# Patient Record
Sex: Female | Born: 1935 | Race: Black or African American | Hispanic: No | Smoking: Former smoker
Health system: Southern US, Community
[De-identification: ages and names within clinical notes are randomized; demographics above are authoritative.]

## PROBLEM LIST (undated history)

## (undated) ENCOUNTER — Emergency Department (HOSPITAL_COMMUNITY): Admission: EM | Disposition: A | Discharge status: Home / Self Care | Payer: Self-pay

## (undated) DIAGNOSIS — M1990 Unspecified osteoarthritis, unspecified site: Secondary | ICD-10-CM

## (undated) DIAGNOSIS — I10 Essential (primary) hypertension: Secondary | ICD-10-CM

## (undated) DIAGNOSIS — N289 Disorder of kidney and ureter, unspecified: Secondary | ICD-10-CM

## (undated) HISTORY — PX: BACK SURGERY: SHX140

---

## 2004-02-08 ENCOUNTER — Inpatient Hospital Stay (HOSPITAL_COMMUNITY): Admission: RE | Admit: 2004-02-08 | Discharge: 2004-02-15 | Payer: Self-pay | Admitting: Specialist

## 2004-02-11 ENCOUNTER — Ambulatory Visit: Payer: Self-pay | Admitting: Physical Medicine & Rehabilitation

## 2004-02-28 ENCOUNTER — Encounter: Admission: RE | Admit: 2004-02-28 | Discharge: 2004-02-28 | Payer: Self-pay | Admitting: Specialist

## 2004-02-28 ENCOUNTER — Encounter: Payer: Self-pay | Admitting: Orthopedic Surgery

## 2004-03-03 ENCOUNTER — Inpatient Hospital Stay (HOSPITAL_COMMUNITY): Admission: AD | Admit: 2004-03-03 | Discharge: 2004-03-04 | Payer: Self-pay | Admitting: Specialist

## 2004-03-08 ENCOUNTER — Emergency Department (HOSPITAL_COMMUNITY): Admission: EM | Admit: 2004-03-08 | Discharge: 2004-03-08 | Payer: Self-pay | Admitting: Emergency Medicine

## 2005-09-10 ENCOUNTER — Encounter: Admission: RE | Admit: 2005-09-10 | Discharge: 2005-09-10 | Payer: Self-pay | Admitting: Nephrology

## 2007-05-19 ENCOUNTER — Ambulatory Visit: Payer: Self-pay | Admitting: Orthopedic Surgery

## 2007-05-19 DIAGNOSIS — M1710 Unilateral primary osteoarthritis, unspecified knee: Secondary | ICD-10-CM

## 2007-05-19 DIAGNOSIS — M25569 Pain in unspecified knee: Secondary | ICD-10-CM

## 2010-06-09 NOTE — Discharge Summary (Signed)
NAMESILVER, ACHEY            ACCOUNT NO.:  [id number]   MEDICAL RECORD NO.:  [id number]          PATIENT TYPE:  INP   LOCATION:  5041                         FACILITY:  [facility]   PHYSICIAN:  Kerrin Champagne, M.D.   DATE OF BIRTH:  04-18-1922   DATE OF ADMISSION:  02/08/2004  DATE OF DISCHARGE:                                 DISCHARGE SUMMARY   ADMISSION DIAGNOSES:  1.  Severe lumbar spinal stenosis with degenerative spondylolisthesis grade      1 at the L3-4 level and grade 1-2 at the L4-5 level.  2.  Hypertension.  3.  Non-insulin-dependent diabetes mellitus.  4.  Osteoporosis.  5.  Status post bilateral cataract removal with lens implants.   DISCHARGE DIAGNOSES:  1.  Severe lumbar spinal stenosis with degenerative spondylolisthesis grade      1 at the L3-4 level and grade 1-2 at the L4-5 level.  2.  Hypertension.  3.  Non-insulin-dependent diabetes mellitus.  4.  Osteoporosis.  5.  Status post bilateral cataract removal with lens implants.  6.  Postoperative anemia requiring blood transfusions.  7.  Constipation, resolved at discharge.   PROCEDURE:  On February 08, 2004 the patient underwent:  1.  Central decompressive laminectomy L3-4 and L4-5 with both L3-4 and L4-5      right-sided transforaminal lumbar interbody fusions.  2.  Posterolateral fusion L3-L5 with Monarch pedicle screws and rod      fixation.  3.  Both local bone graft and Symphony bone graft material used for fusion      sites.   This was performed by Dr. Otelia Sergeant, assisted by Maud Deed, P.A. under  general anesthesia.   CONSULTATIONS:  None.   BRIEF HISTORY:  The patient is a 77 year old female with progressive  neurogenic claudication.  The patient has had worsening of her discomfort to  the point that she stoops for walking.  She has had no significant  improvement in her symptoms with conservative management including  antiinflammatory medications and steroid injections.  Multiple studies have  shown the patient to have severe spinal stenosis of the lumbar spine.  It  was felt that she would require surgical intervention for the lumbar spine  to allow her progressive ambulation as well as return to independent  activities of daily living.  She does have severe degenerative arthritis of  bilateral knees as well.  However, the spinal stenosis was felt to be more  debilitating at this time.  Therefore, she was admitted to undergo the  procedure as stated above.   BRIEF HOSPITAL COURSE:  The patient tolerated the procedure under general  anesthesia without complications.  Intraoperatively, she did require 2 units  of packed red blood cells.  Postoperatively she had low urine output and was  treated with a bolus of IV fluids.  Unfortunately, at that time the fluids  did contain glucose and her blood sugar did become elevated.  After change  in IV fluids, blood sugars were noted to be normal throughout the remainder  of the hospital stay.  Her diabetes was managed quite well by placing her on  her oral medications and avoiding any concentrated sweets.  The patient's  Hemovac drain was discontinued on postoperative day #1.  Her wound was  checked daily.  She did have mild amount of serosanguineous drainage for a  couple of days.  This resolved and the wound was found to be healing well  throughout the remainder of the hospital stay.  The patient had elevated  temperatures initially which were treated with incentive spirometry and  coughing and deep breathing.  This treatment helped to resolve her  temperatures.  The patient's Foley catheter was discontinued and she was  able to void without difficulty.  The patient had constipation which was  treated with oral laxative, stool softeners, as well as suppositories and  she was able to have bowel movements normally.  Her diet was increased  steadily as she was taken off ileus precautions.  The patient's physical  therapy was started on  postoperative day #1.  She was fitted with an LSO  brace and instructed in donning and doffing the brace at bedside.  She was  allowed ambulation, weightbearing as tolerated using a walker.  She was able  to progress fairly steadily.  She was felt to be a possible inpatient rehab  candidate; however, was doing too well to meet their criteria.  Due to the  fact that she had no assistance at home because her daughter had been sick,  she did feel she needed an extended stay somewhere.  It was agreed that she  would need further intensive rehabilitation before returning home to be  independent.  Fortunately, a bed was available at [facility].  On  February 15, 2004 the patient was afebrile, vital signs were stable, and she  was felt stable for transfer.   PERTINENT LABORATORY VALUES:  On admission, CBC with hemoglobin 11.2,  hematocrit 33.1.  The patient received blood transfusion totaling 2 units of  packed red blood cells during the hospital stay.  Prior to discharge, on  February 13, 2004, her hemoglobin was stable at 9.0 with hematocrit 26.1.  Coagulation studies on admission were within normal limits.  Chemistry  studies on admission showed values normal with exception of glucose 107.  All values remained normal throughout the hospital stay with the exception  of glucose being elevated multiple times.  Blood sugars ranged from 107 to  232.  Calcium was mildly decreased at 8.1 at the time of discharge.  Urinalysis on admission showed small leukocyte esterase, few epithelial  cells, 0-2 wbc's, 0-2 rbc's, and few bacteria.  Repeat on January 21 was  negative.  Culture from January 21 showed multiple species present with no  uropathogens isolated.  Chest x-ray on admission showed no acute disease.  EKG on admission:  Probable accelerated junctional rhythm, unconfirmed  analysis on the chart.  PLAN:  The patient will be transferred to [facility].  There she  should receive  physical therapy daily for ambulation and gait training and  back care program.  She is to wear her LSO at all times when out of bed.  She may don and doff the brace while seated at bedside.  She should ambulate  with a walker.  The patient should received occupational therapy for ADLs.  She should have dressing changes daily.  Steri-Strips are present on the  wound and should be allowed to resolve spontaneously.  The wound may be wet  at this time for showering purposes.  She should continue on a low  carbohydrate diabetic diet.   MEDICATIONS AT DISCHARGE:  1.  Lisinopril 2.5 mg daily.  2.  Norvasc 2.5 mg daily.  3.  Avapro 300 mg daily.  4.  Glucophage 500 mg p.o. b.i.d.  5.  Trinsicon one p.o. b.i.d.  6.  P.r.n. medications include:      1.  Vicodin one to two q.4-6h. as needed for pain.      2.  Restoril 15 mg one p.o. q.h.s. p.r.n. sleep.      3.  Robaxin 500 mg one q.8h. as needed for spasm.  7.  The patient does have history of constipation and uses a stool softener      daily.  In the hospital she has received Colace one p.o. b.i.d.  Senokot      and Dulcolax have been used as laxatives as needed.  8.  The patient had some reflux symptoms during the hospital stay and was      placed on Protonix 40 mg p.o. daily.  She should  remain on this as      well.   The patient will follow up with Dr. Otelia Sergeant 2 weeks from the date of her  surgery.  An appointment can be made by calling [phone].  Transportation  should be provided for the patient to come to Dr. Barbaraann Faster office.  Ice may  be used to the back on a p.r.n. basis.  If there are questions or concerns  regarding her orthopedic care, please notify Dr. Barbaraann Faster office at [phone].      SMV/MEDQ  D:  02/15/2004  T:  02/15/2004  Job:  [id number]

## 2010-06-09 NOTE — Discharge Summary (Signed)
NAME:  Kathleen Weeks, Kathleen Weeks            ACCOUNT NO.:  [id number]   MEDICAL RECORD NO.:  [id number]          PATIENT TYPE:  OIB   LOCATION:  5013                         FACILITY:  [facility]   PHYSICIAN:  Kerrin Champagne, M.D.   DATE OF BIRTH:  1923/01/02   DATE OF ADMISSION:  03/03/2004  DATE OF DISCHARGE:                                 DISCHARGE SUMMARY   ADMISSION DIAGNOSES:  1.  Retropulsed cage right L4-5 with L4 and L5 radiculopathy.  2.  2 weeks status post lumbar decompression at L3-4 and L4-5 with 360      degree fusion, both translaminar interbody fusion and posterior      instrumented posterolateral fusion with pedicle screws and rods.  3.  Noninsulin-dependent diabetes mellitus.  4.  Hypertension.  5.  Mild anemia.   DISCHARGE DIAGNOSES:  1.  Retropulsed cage right L4-5 with L4 and L5 radiculopathy.  2.  2 weeks status post lumbar decompression at L3-4 and L4-5 with 360      degree fusion, both translaminar interbody fusion and posterior      instrumented posterolateral fusion with pedicle screws and rods.  3.  Noninsulin-dependent diabetes mellitus.  4.  Hypertension.  5.  Mild anemia.  6.  Urinary tract infection.   PROCEDURE:  On March 03, 2004, the patient underwent removal of  retropulsed cage right L4-5 interspace with bone grafting over the right L4-  5 intervertebral disk space utilizing graft from the cage and local  intervertebral disk space previous graft. This was performed by Dr. Otelia Sergeant,  assisted by Maud Deed, P.A.-C under general anesthesia.   CONSULTATIONS:  None.   BRIEF HISTORY:  The patient is a 77 year old female status post lumbar  decompression at L3-4 and L4-5 with 360 degree fusion for severe spinal  stenosis. She did well in the hospital without complications and was  discharged to [facility]. On her return to Dr.  Barbaraann Faster office approximately 2 to 3 weeks postop, radiographs demonstrated  presence of markers for  the cages at the lowest segment at L4-5 to be  located within the spinal canal. It was felt that the cage retropulsed into  the spinal canal. A followup CT scan demonstrated the cage retropulsed into  the spinal canal. The patient also had severe weakness of the right  dorsiflexion with numbness over the dorsal aspect of the right foot  consistent with a right L4 and L5 radiculopathy. It was felt that she would  require removal of the cage and was admitted for the procedure as stated  above.   BRIEF HOSPITAL COURSE:  The patient tolerated the procedure under general  anesthetic without complications. Postoperatively, she began having better  function of the right EHL and ankle dorsiflexion. Although the function was  better, she was fitted with an AFO to wear inside her shoe until her  function had recovered fully. She initially was treated with PCA analgesics  and required few of these analgesics. She did have flatus and no nausea. She  was eating a regular diet but reported a poor appetite. Her dressing was  changed  on the first postoperative day and her wound was noted to have no  drainage. The patient had a low-grade fever of 100; however, no obvious  signs of infection of the wound. She was noted to have a urinary tract  infection on admission and was started on Cipro 500 mg p.o. b.i.d.  Hemoglobin and hematocrit postoperatively were 9.7 and 28.2 respectively and  felt to be stable. Incentive spirometry was encouraged. Her diet was  advanced slowly. In light of the fact that the patient had no complications  and was stable, it was felt she could return to the nursing facility for  continuation of her physical therapy. Arrangements were made for her to  return to [facility] on her first postoperative day.   PERTINENT LABORATORY VALUES:  As stated above, urinary tract infection was  noted on admission and treated with Cipro 500 mg p.o. b.i.d. Thus far, she  has received only  two doses of medication. As mentioned, metabolic panel  with glucose 137, BUN 4, albumin 3.1, alkaline phosphatase 130, remaining  values normal. Coagulation studies on admission normal. CBC on admission  with hemoglobin 9.5, hematocrit 37.7.   PLAN:  The patient is returning to [facility]. In essence, she is  essentially starting over for her recovery from this surgery. She will need  to have physical therapy for ambulation and gait training daily so that she  can return to an independent level to be discharged to her home. She does  not have home assistance and will need to be stable from a medical as well  as orthopedic standpoint to return to an independent living facility. At the  hospital prior to discharge, she ambulated 30 feet with rolling walker. She  is requiring assistance with activity. The patient does have a back brace  that she should wear at all times when she is ambulatory. She should use a  walker at all times when ambulatory as well. She should follow back  precautions. She may don and doff the brace standing at bedside. Physical  therapy should continue to assist her with strengthening and range of motion  of the lower extremities and she should also continue with ambulation and  gait training. Occupational therapy should assist with ADLs. Dressing change  needs to be done daily. Steri-Strips should be allowed to sluff  spontaneously. The patient may shower 5 days from surgery if there is no  drainage from the wound. At the time of discharge, she does have a Foley  catheter. She has had two doses of Cipro for UTI. Once the patient has had a  chance to be more ambulatory, the catheter may be removed and Cipro should  be completed for a total of 5 days.   DISCHARGE MEDICATIONS:  1.  Glucophage 500 mg p.o. b.i.d.  2.  Avapro 300 mg p.o. once daily.  3.  Lisinopril 2.5 mg p.o. once daily.  4.  Colace p.o. b.i.d. 5.  Trinsicon p.o. b.i.d.  6.  Norvasc p.o.  once daily.  7.  Robaxin 500 mg one every 8 hours as needed for spasm.  8.  Vicodin one to two every 4 to 6 hours as needed for pain.  9.  She may have Restoril 15 mg one p.o. q.h.s. p.r.n. sleep.   DISCHARGE DIET:  At the nursing facility, she should be on a low-  carbohydrate diabetic diet. Laxative of choice or enema of choice should be  used for constipation.   FOLLOWUP:  The patient should follow up with Dr. Otelia Sergeant 2 weeks from the date  of her discharge. If there are questions or concerns regarding her  orthopedic status, please notify Dr. Barbaraann Faster office. We have also asked that  incentive spirometry be utilized and encouraged every 1 hour while awake.   CONDITION ON DISCHARGE:  Stable.      SMV/MEDQ  D:  03/04/2004  T:  03/04/2004  Job:  [id number]

## 2010-06-09 NOTE — Op Note (Signed)
NAME:  Kathleen Weeks, Kathleen Weeks            ACCOUNT NO.:  [id number]   MEDICAL RECORD NO.:  [id number]          PATIENT TYPE:  INP   LOCATION:  2550                         FACILITY:  [facility]   PHYSICIAN:  Kerrin Champagne, M.D.   DATE OF BIRTH:  August 04, 1922   DATE OF PROCEDURE:  02/08/2004  DATE OF DISCHARGE:                                 OPERATIVE REPORT   PREOPERATIVE DIAGNOSIS:  Severe lumbar spinal stenosis with degenerative  spondylolisthesis, grade 1 at the L3-4 level and grade 1-2 at the L4-5  level.   POSTOPERATIVE DIAGNOSIS:  Severe lumbar spinal stenosis with degenerative  spondylolisthesis, grade 1 at the L3-4 level and grade 1-2 at the L4-5  level.   PROCEDURE:  1.  Central decompressive laminectomy L3-4 and L4-5 with both L3-4 and L4-5      right-sided T-lifts (transforaminal lumbar interbody fusion) using a 7      mm leopard cage at the L3-4 level and a 9 mm cage at the L4-5 level.  2.  Posterolateral fusion L4-5 to L3 to L5 with Monarch pedicle screw and      rod fixation; 45 and 40 mm screws were used and 65 mm curved rods.  3.  Both local bone graft and Symphony bone graft material was used during      the procedure.  Intraoperative pedicle screw monitoring as well.  Cell      Saver was used.  No blood returned.   SURGEON:  Dr. Vira Browns.   ASSISTANT:  Maud Deed, PAC.   ANESTHESIA:  General via oral tracheal intubation, Dr. Gypsy Balsam and Dr.  Randa Evens.   SPECIMENS:  None.   ESTIMATED BLOOD LOSS:  250 mL.   COMPLICATIONS:  None.   HISTORY OF PRESENT ILLNESS:  A 77 year old female with progressive  increasing neurogenic claudication.  She ambulates stooped over with severe  pain into both flanks and lower extremities with ambulation upright position  somewhat improved by sitting.  No significant improvement with conservative  management including use of anti-inflammatory agents, steroids, etc.  The  patient has severe osteoarthritis of her knees, but it is felt  that the  biggest problem at this point is stenosis with inability to straighten upper  back and walk upright due to the severe nature of the spinal stenosis and  lumbar spine.  She is brought to the operating room to undergo central  decompression of both L3-4 and L4-5 with 360-degree fusions to the  spondylolisthesis of the second segments.   INTRAOPERATIVE FINDINGS:  As above under postoperative findings which  resembled preoperative findings.  The patient found to have severe spinal  stenosis at L3-4, L4-5 centrally.  Required bilateral foraminal  decompressions.  T-lift was done to stabilize both the L3-3 and L4-5  segments.  Cell Saver blood did not require return, as the patient did not  have a significant enough blood loss to allow for return of blood from the  Cell Saver.   DESCRIPTION OF PROCEDURE:  After adequate general anesthesia, the patient in  the prone position, chest rolls and pillows underneath the thighs and legs  to  extend the hips and lower lumbar spine.  Jean Rosenthal table was used, and all  pressure points were well-padded.  The patient had a Foley catheter placed  as well as intraoperative monitoring electrodes of both lower extremities  for soft tissue resistance as well as pedicle screw resistance and EMG  monitoring.  The patient had Foley catheter placed.  She was then turned to  the supine position after intubation.  Are pressure points were well-padded  and the neck placed in the position that was comfortable without  hyperextension.  All lower extremity pressure points were well padded as  well.  TED hoes were in place to try and prevent deep venous thrombosis.  Standard prep with DuraPrep solution from the lower thoracic level to the  mid sacral level.  Draped in the usual manner, Iodine Vi-Drape was used,  standard preoperative antibiotics of Ancef. Incision in the midline from  about L2 to S1 through the skin and subcutaneous layers down to the spinous   processes at these levels, and electrocautery was used to control bleeders.  The lumbodorsal fascia incised on both sides of the spinous processes at the  expected L4, L3-L2 level.  Intraoperative radiograph obtained.  Intraoperative C-arm was used to identify levels.  Clamps were at the L4 and  L3 levels.  These were marked by removal of small portion of bone using a  Baer rongeur for continued identification.  The incision was continued  caudally an additional one level to the L5-S1 level.  Cobb was then used to  elevate the paralumbar muscles off of the posterior aspect of the elements  of the lateral aspects of the spinous process of L5, L4, L3, L2 and then  carried out over the posterior aspect of lamina of L5, L4, L3, L2, the facet  preserved at the L2-3 level, facet capsules resected at the L3-4 level and  L4-5 levels bilaterally.  The exposure continued out to the transverse  processes over the lateral aspect of the L4-5 facet bilaterally, exposing  the L5 transverse processes and the L4 transverse processes at the L3-4  level and then the L3 transverse processes at the L2-3 level, preserving the  facet capsule again at the L2-3 level bilaterally.  These areas were then  packed with sponges to obtain hemostasis. Leksell rongeur was then used to  resect the spinous process superiorly of L5 over about 20% of this area.  The interspinous process ligament and then the spinous process of L4 and the  spinous process of L3 and partially the inferior 25% of the spinous process  of L2.  A Leksell was used to thin the posterior aspect of the central  lamina at L4 and L3.  The osteotome then used to perform partial  facetectomies bilaterally, the inferior articular process of L4 and L3  removing bone to allow for bone grafting purposes later.  Central  laminectomy was then performed using 3 mm Kerrison, resecting the bone centrally, decompression centrally and then carrying this out  laterally.  Ligamentum flavum was resected at the L4-5 level, L3-4 levels, as well as  the L2-3 levels and partial medial facetectomy carried out at L2-3 in order  to allow for a lateral recess decompression of bilateral L3 nerve roots.  At  this point, then the lateral recesses were decompressed bilaterally at L3-4  and L4-5.  Foraminotomy was performed over both L5 nerve roots until the  nerve roots were completely free in their exiting.  The lateral recess  was  decompressed out to the pedicle of L5 bilaterally, L4 bilaterally, and L3  bilaterally.  The foramen of the L3 nerve root, both sides, was carefully  probed with a hockey-stick neural probe used to probe the interval between  the nerve root and the overlying reflected portion of ligamentum flavum  which was subluxed anteriorly.  This was then resected along with superior  articular process of L4 and of L5, decompressing the neural foramen of both  L3 nerve roots and L4 nerve roots.  On the right side in particular,  decompression was carried out with full inferior articular process resection  of L3 and of L4 and partial resection of the superior articular process of  L5 and the L4 in order to well-decompress the nerve roots at this level that  was slipped.  On the left side similarly, the nerve roots were followed out  well into the neural foramen until they were just completely decompressed  with resection of the reflected portion of ligamentum flavum and anterior  aspect of the facet joints of both L3-4 and L4-5.  With this, then attention  was turned to placement of the pedicle screws, beginning on the left side.  Bone graft was removed centrally, several fragments that were of excellent  quality of cancellus bone were kept for placement into the cages for T-lift  purposes.  The remaining bone graft was mixed with allograft bone graft  material and made into two logs of Symphony bone graft material.  These were  used for  posterolateral fusion mass as well as within the interbody fusion  mass anterior to the cages.  Beginning on the left side, then careful  exposure of the left aspect of the superior articular process of L3, the  transverse process of L3, in the intersection of these, then in the  midportion of the transverse process, an awl was used to make an opening  into the lateral aspect of the pedicle.  Pedicle probe was then used to  probe the pedicle on the left side at L3, C-arm fluoroscopy used to  carefully ascertain correct position alignment of this.  Ball tip probe used  to probe the channel to ensure that there was no broaching of cortex.  Tapping performed at the L3 level after measuring the depth of about 40 mm  on the left. Decortication carried out in the transverse process of L3 and  then bone graft applied, then 40 mm 6.25 screw was placed into this pedicle  on the left side at L3.  The L4 level decortication was carried out, again over the transverse process and lateral aspect superior articular of L4.  I  made a hole in the midportion of the transverse process at this intersection  of superior articular process of L4 and slightly inferior to this level, awl  used to make the opening and then a pedicle finder used to probe the  channel.  This was a straight pedicle finder and probed to 40 mm.  Tapping  was performed, bone graft then applied.  Symphony bone graft was applied  over the patient's transverse process level and then the 40 mm 6.25 screw  placed after tapping appropriately and testing using ball tip probe.  Finding at the five level on the left side similarly decortication carried  out transverse process, awl used to make an entry point over the inferior  aspect of the transverse process at its intersection with the superior  articular process of L5 and  then a pedicle finder used to probe the pedicle.  Depth at this level 40 mm as well was chosen. Tapping performed using a  5.5  tap, and then the appropriate 6.25 screw was placed on the left side.  Bone  graft was placed over the transverse process extending from L3 to L4 to L5,  lateral to the pedicle screw fixation area.  A 65 mm rod was then placed  within the head fasteners after testing soft tissue resistance which was  greater than 20 in all three levels of the left side.  Once this was placed,  then free hand placement of the pedicle screw head fastener caps was done  without difficulty.  Attention then turned to the right side in which case  transverse processes were all exposed nicely, as was the lateral aspect  superior to the process of L3, L4, and L5.  An awl then used to perform an  opening in the midportion of the transverse process in this intersection,  superior articular process of L3, a hand-held pedicle finder then used to  probe the pedicle channel on the right side at L3.  Similarly this was done  at L4 and then at L5 on the right side; 45 mm screws were placed at L3 and  L4 on the right side, L5 a 40 mm screw was placed converging appropriately  on the right side.  Measurement of depth at 45 mm.  Decortication of the  transverse process was carried out, and similarly Symphony bone graft placed  lateral to the pedicle screws, and these were placed prior to placement of  the pedicle screws at L3, L4, and L5, tapping with the 5.5 taps and testing  the screw holes prior to the insertion of the screw to ensure that there was  no broaching of cortex.  Again, measurement of soft tissue resistance  through the screws using the pedicle screw monitoring device indicated soft  tissue resistance greater than 20 at each level on the right side.  This  completed, the 65 mm rods carefully placed in the screw heads on the right  side, extending from L3 to L4 to L5, 65 mm in length, free-hand caps were  then placed on the fasteners without difficulty.  Attention turned to the T- lift portion of the case  in which case the L3-4 level was the initial level  begun at this.  This was lower than the L4-5 level and collected a lot  easier.  Irrigation performed, sponges placed appropriately on the opposite  left side.  And on the right side, then retraction of the thecal sac  medially and the L3 nerve root superiorly, disk space incised with the 15  blade scalpel.  Pituitary rongeurs micro were used to perform partial  diskectomy and then a dilator disk space was then placed, using an 8 mm  dilator.  No larger dilator could be placed, and it was felt that 8 was the  appropriate size.  A 7 mm cage was to be used at this level.  Carefully the  disk space was repaired using some distraction between the pedicle screws on  the right side at L3 and L4.  Curetting the endplates using ring curettes  and pituitary rongeurs to debride the disk space degenerative disk material.  This completed, then a trial with a 7 mm trial was placed, and this seemed  to provide an excellent distraction in the disk space, restoration of normal  alignment of the  spine.  A 7 mm leopard cage with parallel end plates was  chosen, and this was packed with local bone graft.  With the disk space  distracted, the disk space was then packed further with Symphony bone graft  and local bone graft material until enough bone graft was felt to be  present, and the cage could then be placed.  Careful retraction of the  thecal sac and the L3 nerve root then on the right side, and the 7 mm cage  was introduced into the disk space, impacted into place, moving across the  midline, inserted on the C-arm to be in excellent position alignment.  Well  anterior to the posterior intervertebral disk line.  With this in place,  then attention was turned to the L4-5 level.  Again retraction of the thecal  sac medially in the L4 nerve root superiorly disk space, it was carefully  examined.  Disk space was much larger than that at the 3-4 level; 15  mm  blade used to incise the disk.  Pituitary rongeurs used to excise the disk,  curettage performed of both the inferior aspect of the endplate of L4 and  the superior aspect of L5 until the bony endplates cartilaginous endplates  were down to bleeding bone endplates.  Pituitaries used to debride the disk  space.  Note that the disk space was dilated to 9 mm and felt to be the  largest that could be accepted in this disk space.  A trial with a 9 mm  trial implant indicated that the disk space was distracted quite nicely and  reduced quite nicely with the 9 mm cage.  Therefore a 9 mm leopard cage  parallel endplates was then packed with bone graft.  Disk space felt to be  well prepared with curettage and pituitary rongeurs in the endplates and  bleeding bone end plates, and then the disk space was packed with bone graft  material, Symphony bone graft material with distraction present.  A 9 mm  cage then placed over the posterior aspect of the disk space on the right side with the thecal sac and nerve root carefully retracted.  This was then  impacted in place and kicked across the midline and fashioned position  alignment, clearly anterior to the posterior aspect of the disk space as in  the L3-4 level.  With this then performed, the rod was then carefully  attached to the fastener at the L3 level on the right side and left side to  a 85 foot pounds.   Compression was then obtained between the L3 and L4 pedicle screws and the  rod then fastened to the pedicle screw head at the L4 level bilaterally  using the torque device 80 foot pounds.  Attention then turned to the L5  level where similarly compression was obtained both right and left side over  the graft.  The L5 fasteners then connected the rod to 80 foot pounds.  This  completed, AP and lateral views in the C-arm were obtained for permanent  documentation purposes.  Irrigation was performed posterior aspect of the  disk space.   Careful evaluation demonstrated some small bleeders controlled  with bipolar electrocautery along the right side at the L3-4 and L4-5 entry  spots for T-lift.  The nerve roots on the right side felt to be in excellent  position and excellent condition.  A hockey-stick neural probe easily passed  out the neural foramen bilaterally at L3, L4, and  L5 levels.  Extra bone  graft was then applied out lateral to the patient's pedicle screws,  extending from L3 to L4 to L5 and the constructs posterolaterally, obtaining  more posterolateral fusion mass.  This completed, careful inspection in the  midline demonstrated no bony fragments present within the soft tissue and  after further irrigation, then the application of platelet for C-arm was  then applied to the bleeding surfaces of the muscles and deep structures.  A  medium Hemovac drain placed in the depth of the incision.   The paralumbar muscles carefully approximated in the midline with  interrupted #1 Vicryl sutures, care taken to miss the drain.  Lumbodorsal  fascia approximated in the midline with some figure-of-eight sutures of #1  Vicryl.  Deep subcu layers approximated with interrupted 0 Vicryl sutures,  the more superficial layers with interrupted 2-0 Vicryl suture, and the skin  closed with a running subcu stitch of 4-0 Vicryl.  Tincture of Benzoin and  4x4s, ABD pads fixed to the skin with Hypafix tape including and  incorporating the drain and the dressing.  The patient was then returned to  a supine position, reactivated, extubated, and returned to the recovery room  in satisfactory condition.  All instrument and sponge counts were correct.      JEN/MEDQ  D:  02/08/2004  T:  02/08/2004  Job:  [id number]

## 2010-06-09 NOTE — Op Note (Signed)
NAME:  Kathleen Weeks, Kathleen Weeks NO.:  [id number]   MEDICAL RECORD NO.:  [id number]          PATIENT TYPE:  OIB   LOCATION:  5013                         FACILITY:  [facility]   PHYSICIAN:  Kerrin Champagne, M.D.   DATE OF BIRTH:  December 27, 1922   DATE OF PROCEDURE:  03/03/2004  DATE OF DISCHARGE:                                 OPERATIVE REPORT   PREOPERATIVE DIAGNOSES:  1.  Retropulsed Leopard the DePuy cage, right L4-5, at site of previous 360-      degree fusion for spondylolisthesis.  2.  Right L4 and right L5 radiculopathy.   POSTOPERATIVE DIAGNOSES:  1.  Retropulsed Leopard the DePuy cage, right L4-5, at site of previous 360-      degree fusion for spondylolisthesis.  2.  Right L4 and right L5 radiculopathy.   PROCEDURE:  Removal of retropulsed cage, right L4-5 interspace, with bone  grafting over the right L4-5 intervertebral disk space utilizing a graft  from the cage and local intervertebral disk space previous graft.   SURGEON:  Kerrin Champagne, M.D.   ASSISTANT:  Maud Deed, PA.-C.   ANESTHESIA:  GOT, Dr. Laverle Hobby.   ESTIMATED BLOOD LOSS:  50 mL.   DRAINS:  Foley to straight drain   BRIEF CLINICAL HISTORY:  This patient is a 77 year old female who underwent  previous lumbar decompression at L3-4 AND L4-5  with 360 degree-fusion using  Leopard cages at both segments with posterior instrumentation with pedicle  screws and rods and posterolateral fusion using both local bone graft and  Symphony bone graft material.  The patient underwent procedure for two-level  degenerative disk disease and spondylolisthesis of both L3-4 and L4-5 with  severe spinal stenosis.  Following surgery, the patient did well.  She  eventually was transferred to a nursing home.  ON her return visit to the  office some three weeks postoperatively, radiographs demonstrated the  presence of markers for the cages at the lowest  segment at L4-5 to be  located within the spinal canal.   Reviewing the previous intraoperative C-  arm fluoro, it was noted that these were well within the intervertebral disk  space.  The cage was felt to have retropulsed into the spinal canal.  A  follow-up CT scan demonstrated the cage as such, and she is brought to the  operating room today to undergo removal of the Leopard cage at the right L4-  5 level.  The patient was noted at the time of her return appointment to have severe  weakness in right foot dorsiflexion with numbness over the dorsal aspect of  the foot consistent with a right L4 and L5 radiculopathy.   INTRAOPERATIVE FINDINGS:  The patient was found to have retropulsion of the  cage on the right side into the spinal canal compressing the right side of  thecal sac at the L4-5 level in both the L5 and L4 nerve roots.  She  underwent removal of the cage using loupe magnification and headlamp via  exploration of the right side L4-5 laminotomy.   DESCRIPTION OF PROCEDURE:  After adequate general anesthesia, the  patient  transferred to the [facility] operating table, chest rolls,  pillows over the  legs, all pressure points well-padded.  She underwent a standard prep with  DuraPrep solution over the previous lower lumbar and lower dorsal region  extending from the dorsal region to the S2 level. Standard draping,  iodine  Vi-Drape was used.  Incision in the midline over the inferior one-half of  the incision through the skin and subcu layers, removing the old sutures as  they were found trough the lumbar dorsal fascia, and this was incised and  previous old incision scar and sutures removed.  Using blunt dissection,  then the soft tissues were then carefully opened down to the level of the  patient's posterior lamina of L5 and along the area of the posterior aspect  of the previous laminotomy site.  A Boss-McCullough retractor was then  inserted and exposure obtained.  The right L5 pedicle screw immediately  identified.  It was in good  condition, did not demonstrate any sign of  loosening.  The patient's CT scan preoperatively also did not demonstrate  any significant loosening of screws, only retropulsion of the cage.   Assessing the area of the lamina of L5, it was carefully debrided of soft  tissue and early granulation and then the medial aspect of the L5 pedicle  identified using large curettes.  Then using a D'Errico and a Penfield 4,  the medial aspect of the L5 pedicle was then carefully explored down to the  level of the L4-5 intervertebral disk space.  This was then carried  medially, identifying the cage and then carefully retracting soft tissue  over the lateral and posterior aspect of the cage.  A hole for the cage over  the lateral aspect of the cage was identified and pressure applied to this  in order to try and replace the cage anteriorly to allow for the cage be  moved better and to provide last tension against the dura.  With this, then  the cage was able to be rocked laterally.  Once this was completed, then the  cage was able to be pulled from the intervertebral disk space using a Kocher  clamp.  A bone graft then removed from the cage and from the posterior  aspect of the disk space was then replaced into the disk space after careful  inspection and irrigation.  The patient underwent a Valsalva, and there was  no evidence of dural tear or dural leak present.  Bone graft was then  replaced within the intervertebral disk space at the L4-5 level and impacted  into place using the pusher impactor that was flat on the right side.  Hockey stick neural probe was then carefully passed out the L5 and L4 neural  foramen demonstrating their patency on the right side as well as superiorly.  Thrombin-soaked Gelfoam then placed over the laminotomy region posteriorly.  Soft tissues allowed to fall back into place.  Intraoperative radiograph  demonstrated the cage to be completely removed at this point, and the  soft tissues were then reapproximated over the previous laminotomy region using  #1 Vicryl sutures approximated loosely.  Lumbar dorsal fascia approximated  with interrupted simple sutures of #1 Vicryl, deep subcu layers approximated  with interrupted #1 and 0 Vicryl sutures, more superficial layers with  interrupted 2-0 Vicryl sutures, and the skin closed with a running subcu  stitch of 4-0 Vicryl.  Tincture of Benzoin and Steri-Strips applied.  The  patient then had  placement of 4x4s, ABD pad fixed the skin with Hypafix  tape.  The patient was then returned to a supine position, reactivated,  extubated, and returned to the recovery room in satisfactory condition.  All  instrument and sponge counts were correct.      JEN/MEDQ  D:  03/03/2004  T:  03/03/2004  Job:  [id number]

## 2011-07-24 ENCOUNTER — Emergency Department (INDEPENDENT_AMBULATORY_CARE_PROVIDER_SITE_OTHER)
Admission: EM | Admit: 2011-07-24 | Discharge: 2011-07-24 | Disposition: A | Discharge status: Home / Self Care | Payer: Medicare Other | Source: Home / Self Care | Attending: Emergency Medicine | Admitting: Emergency Medicine

## 2011-07-24 ENCOUNTER — Encounter (HOSPITAL_COMMUNITY): Payer: Self-pay | Admitting: *Deleted

## 2011-07-24 DIAGNOSIS — I10 Essential (primary) hypertension: Secondary | ICD-10-CM

## 2011-07-24 DIAGNOSIS — E119 Type 2 diabetes mellitus without complications: Secondary | ICD-10-CM

## 2011-07-24 DIAGNOSIS — L723 Sebaceous cyst: Secondary | ICD-10-CM

## 2011-07-24 HISTORY — DX: Essential (primary) hypertension: I10

## 2011-07-24 LAB — POCT I-STAT, CHEM 8
Creatinine, Ser: 1.4 mg/dL — ABNORMAL HIGH (ref 0.50–1.10)
HCT: 39 % (ref 36.0–46.0)
Hemoglobin: 13.3 g/dL (ref 12.0–15.0)
Potassium: 4.7 mEq/L (ref 3.5–5.1)
Sodium: 138 mEq/L (ref 135–145)
TCO2: 25 mmol/L (ref 0–100)

## 2011-07-24 MED ORDER — GLIPIZIDE ER 2.5 MG PO TB24: 2.5000 mg | ORAL_TABLET | Freq: Every day | ORAL | Status: DC

## 2011-07-24 MED ORDER — AMLODIPINE BESYLATE 5 MG PO TABS: 5.0000 mg | ORAL_TABLET | Freq: Every day | ORAL | Status: DC

## 2011-07-24 NOTE — ED Provider Notes (Signed)
Chief Complaint  Patient presents with  . Facial Swelling    History of Present Illness:   Kathleen Weeks is an 78 year old female who presents today with a cyst behind her right ear that has been present for several months. It has not been enlarging in size, is not tender, and has had no drainage. It's not bothering her in any way, she just comes in today to have it checked out.  She also has a history of high blood pressure. She's been off her meds for several months. She gets rare headaches, no dizziness or lightheadedness, no shortness of breath or chest pain. She has no history of kidney problems or elevated cholesterol. She does have a history of diabetes but was taken off her medication for that. She hasn't had any polyuria, polydipsia, or blurred vision.  Review of Systems:  Other than noted above, the patient denies any of the following symptoms. Systemic:  No fever, chills, sweats, fatigue, myalgias, headache, or anorexia. Eye:  No redness, pain or drainage. ENT:  No earache, nasal congestion, rhinorrhea, sinus pressure, or sore throat. Lungs:  No cough, sputum production, wheezing, shortness of breath.  Cardiovascular:  No chest pain, palpitations, or syncope. GI:  No nausea, vomiting, abdominal pain or diarrhea. GU:  No dysuria, frequency, or hematuria. Skin:  No rash or pruritis.  PMFSH:  Past medical history, family history, social history, meds, and allergies were reviewed.  Physical Exam:   Vital signs:  BP 183/102  Pulse 95  Temp 98.6 F (37 C) (Oral)  Resp 20  SpO2 98% General:  Alert, in no distress. Eye:  PERRL, full EOMs.  Lids and conjunctivas were normal. ENT:  TMs and canals were normal, without erythema or inflammation.  Nasal mucosa was clear and uncongested, without drainage.  Mucous membranes were moist.  Pharynx was clear, without exudate or drainage.  There were no oral ulcerations or lesions. She has a 1 cm sebaceous cyst behind her right ear. This was not tender,  red, or inflamed and not draining any pus. Neck:  Supple, no adenopathy, tenderness or mass. Thyroid was normal. Lungs:  No respiratory distress.  Lungs were clear to auscultation, without wheezes, rales or rhonchi.  Breath sounds were clear and equal bilaterally. Heart:  Regular rhythm, without gallops, murmers or rubs. Abdomen:  Soft, flat, and non-tender to palpation.  No hepatosplenomagaly or mass. Skin:  Clear, warm, and dry, without rash or lesions.  Labs:   Results for orders placed during the hospital encounter of 07/24/11  POCT I-STAT, CHEM 8      Component Value Range   Sodium 138  135 - 145 mEq/L   Potassium 4.7  3.5 - 5.1 mEq/L   Chloride 101  96 - 112 mEq/L   BUN 20  6 - 23 mg/dL   Creatinine, Ser 6.04 (*) 0.50 - 1.10 mg/dL   Glucose, Bld 540 (*) 70 - 99 mg/dL   Calcium, Ion 9.81  1.91 - 1.32 mmol/L   TCO2 25  0 - 100 mmol/L   Hemoglobin 13.3  12.0 - 15.0 g/dL   HCT 47.8  29.5 - 62.1 %     Assessment:  The primary encounter diagnosis was Sebaceous cyst. Diagnoses of Essential hypertension and DM (diabetes mellitus), type 2 were also pertinent to this visit. The sebaceous cyst doesn't need any attention right now. She is content to just leave it be. I offered to refer her to a dermatologist for excision, but she doesn't want to have  this done right now. She does need attention to her blood pressure and her diabetes. I have started her on medication for this and asked her to followup with her primary care physician, Dr. Velna Hatchet in about 2 weeks. Also discussed salt and sodium avoidance as well as sugar and sweets.  Plan:   1.  The following meds were prescribed:   New Prescriptions   AMLODIPINE (NORVASC) 5 MG TABLET    Take 1 tablet (5 mg total) by mouth daily.   GLIPIZIDE (GLUCOTROL XL) 2.5 MG 24 HR TABLET    Take 1 tablet (2.5 mg total) by mouth daily.   2.  The patient was instructed in symptomatic care and handouts were given. 3.  The patient was told to return  if becoming worse in any way, if no better in 3 or 4 days, and given some red flag symptoms that would indicate earlier return.    Reuben Likes, MD 07/24/11 [phone]

## 2011-07-24 NOTE — ED Notes (Signed)
Pt  Reports  She    Has   A  Growth  Behind       r  Ear          denys  Any  Pain       Appears  To  Not  Be  Infected        The  Pt           Reports  It  Has  Been there  For  Several  Months         Her  bp  Is  High  And  She  Takes  No  meds  At this  Time  -  She  Appears  In   No  Acute  Distress

## 2012-10-18 ENCOUNTER — Emergency Department (HOSPITAL_COMMUNITY)
Admission: EM | Admit: 2012-10-18 | Discharge: 2012-10-18 | Disposition: A | Discharge status: Home / Self Care | Payer: Medicare Other | Attending: Emergency Medicine | Admitting: Emergency Medicine

## 2012-10-18 ENCOUNTER — Encounter (HOSPITAL_COMMUNITY): Payer: Self-pay | Admitting: *Deleted

## 2012-10-18 DIAGNOSIS — I10 Essential (primary) hypertension: Secondary | ICD-10-CM | POA: Insufficient documentation

## 2012-10-18 DIAGNOSIS — E119 Type 2 diabetes mellitus without complications: Secondary | ICD-10-CM | POA: Insufficient documentation

## 2012-10-18 DIAGNOSIS — H538 Other visual disturbances: Secondary | ICD-10-CM | POA: Insufficient documentation

## 2012-10-18 DIAGNOSIS — Z7982 Long term (current) use of aspirin: Secondary | ICD-10-CM | POA: Insufficient documentation

## 2012-10-18 DIAGNOSIS — Z79899 Other long term (current) drug therapy: Secondary | ICD-10-CM | POA: Insufficient documentation

## 2012-10-18 DIAGNOSIS — H547 Unspecified visual loss: Secondary | ICD-10-CM

## 2012-10-18 LAB — POCT I-STAT, CHEM 8
BUN: 37 mg/dL — ABNORMAL HIGH (ref 6–23)
Creatinine, Ser: 1.8 mg/dL — ABNORMAL HIGH (ref 0.50–1.10)
Glucose, Bld: 135 mg/dL — ABNORMAL HIGH (ref 70–99)
Hemoglobin: 12.2 g/dL (ref 12.0–15.0)
Potassium: 3.9 mEq/L (ref 3.5–5.1)
Sodium: 141 mEq/L (ref 135–145)
TCO2: 23 mmol/L (ref 0–100)

## 2012-10-18 LAB — GLUCOSE, CAPILLARY: Glucose-Capillary: 124 mg/dL — ABNORMAL HIGH (ref 70–99)

## 2012-10-18 LAB — PROTIME-INR: INR: 1.01 (ref 0.00–1.49)

## 2012-10-18 NOTE — ED Notes (Signed)
Pt and family member reports that pt having blurred vision since yesterday, denies any weakness or pain. Grips are equal, no acute distress noted at triage.

## 2012-10-18 NOTE — ED Notes (Signed)
Pt reports slight blurred vision x 3 days. Denies lightheadedness, double vision or dizziness. Denies difficulty with ambulation. Denies pain. Pt resting comfortably  In bed.

## 2012-10-18 NOTE — ED Provider Notes (Signed)
CSN: [id number]     Arrival date & time 10/18/12  1327 History   First MD Initiated Contact with Patient 10/18/12 1556     Chief Complaint  Patient presents with  . Loss of Vision   (Consider location/radiation/quality/duration/timing/severity/associated sxs/prior Treatment) The history is provided by the patient.   Patient presents to the ED for visual disturbance. She states "I just can't see like i used to."  States over the past week she has noticed a decline in her visual acuity.  No trauma, FB, or chemical exposure to eye. She states objects are somewhat blurry but she is not having any diplopia or complete loss of vision.  Pt has HTN and DM, does not have regular eye exams-- states last eye exam was 10+ years ago.  Pt does have glasses but does not wear them on a regular basis.  Hx of cataracts with surgery 15 years ago, no problems since.  States blood sugar at home has been WNL.  Denies any headaches, photophobia, aura, tinnitus, changes in speech, confusion, numbness, paresthesias, or weakness.  No chest pain or SOB. PCP-- Dr. Dorothyann Peng  Past Medical History  Diagnosis Date  . Hypertension   . Diabetes mellitus    Past Surgical History  Procedure Laterality Date  . Back surgery     History reviewed. No pertinent family history. History  Substance Use Topics  . Smoking status: Not on file  . Smokeless tobacco: Not on file  . Alcohol Use: No   OB History   Grav Para Term Preterm Abortions TAB SAB Ect Mult Living                 Review of Systems  Eyes: Positive for visual disturbance.  All other systems reviewed and are negative.    Allergies  Review of patient's allergies indicates no known allergies.  Home Medications   Current Outpatient Rx  Name  Route  Sig  Dispense  Refill  . EXPIRED: amLODipine (NORVASC) 5 MG tablet   Oral   Take 1 tablet (5 mg total) by mouth daily.   30 tablet   1   . aspirin 162 MG EC tablet   Oral   Take 81 mg by mouth  daily.         [name] EXPIRED: glipiZIDE (GLUCOTROL XL) 2.5 MG 24 hr tablet   Oral   Take 1 tablet (2.5 mg total) by mouth daily.   30 tablet   1    BP 138/60  Pulse 91  Temp(Src) 98.3 F (36.8 C) (Oral)  Resp 18  SpO2 100%  Physical Exam  Nursing note and vitals reviewed. Constitutional: She is oriented to person, place, and time. She appears well-developed and well-nourished. No distress.  HENT:  Head: Normocephalic and atraumatic.  Mouth/Throat: Oropharynx is clear and moist.  No temporal pain  Eyes: Conjunctivae, EOM and lids are normal. Pupils are equal, round, and reactive to light. Right eye exhibits no discharge. Left eye exhibits no discharge. Right conjunctiva is not injected. Right conjunctiva has no hemorrhage. Left conjunctiva is not injected. Left conjunctiva has no hemorrhage. Right eye exhibits no nystagmus. Left eye exhibits no nystagmus.  PERRL, EOM intact and non-painful; no conjunctival injection, hemorrhage, FB, or signs of infection  Neck: Normal range of motion. Neck supple.  Cardiovascular: Normal rate, regular rhythm and normal heart sounds.   Pulmonary/Chest: Effort normal and breath sounds normal. No respiratory distress. She has no wheezes.  Musculoskeletal: Normal range of motion.  She exhibits no edema.  Neurological: She is alert and oriented to person, place, and time. She has normal strength. She displays no tremor. No cranial nerve deficit or sensory deficit. She displays no seizure activity. Gait normal.  CN grossly intact, moves all extremities appropriately without ataxia, no focal neuro deficits or facial droop appreciated; gait assisted with cane at baseline  Skin: Skin is warm and dry. She is not diaphoretic.  Psychiatric: She has a normal mood and affect. Her speech is normal.    ED Course  Procedures (including critical care time) Labs Review Labs Reviewed  GLUCOSE, CAPILLARY - Abnormal; Notable for the following:    Glucose-Capillary 124  (*)    All other components within normal limits  POCT I-STAT, CHEM 8 - Abnormal; Notable for the following:    BUN 37 (*)    Creatinine, Ser 1.80 (*)    Glucose, Bld 135 (*)    All other components within normal limits  PROTIME-INR   Imaging Review No results found.  MDM   1. Worsening vision    Pt with worsening visual acuity, no headaches, tinnitus, confusion, or weakness.  No focal neuro deficits on PE-- i doubt TIA, stroke, SAH, ICH.  Visual acuity R 20/40, L 20/50, Bilat 20/50. PT/INR WNL.  Chem 8 revealing slightly worsening renal function when compared with 1 year ago.  I have strongly encouraged pt to have an eye exam and have referred her to opthalmology, Dr. [name].  Encouraged her to wear her glasses.  FU with PCP to monitor renal function.  Discussed plan with pt, she agreed. Return precautions advised.  Discussed with Dr. Rubin Payor who agrees with assessment and plan of care.  Garlon Hatchet, PA-C 10/18/12 1902

## 2012-10-19 NOTE — ED Provider Notes (Signed)
Medical screening examination/treatment/procedure(s) were performed by non-physician practitioner and as supervising physician I was immediately available for consultation/collaboration.  Ralph Benavidez R. Bette Brienza, MD 10/19/12 1102 

## 2012-11-07 ENCOUNTER — Emergency Department (HOSPITAL_COMMUNITY)
Admission: EM | Admit: 2012-11-07 | Discharge: 2012-11-07 | Disposition: A | Discharge status: Home / Self Care | Payer: Medicare Other | Attending: Emergency Medicine | Admitting: Emergency Medicine

## 2012-11-07 ENCOUNTER — Emergency Department (HOSPITAL_COMMUNITY): Payer: Medicare Other

## 2012-11-07 ENCOUNTER — Encounter (HOSPITAL_COMMUNITY): Payer: Self-pay | Admitting: Emergency Medicine

## 2012-11-07 DIAGNOSIS — Z79899 Other long term (current) drug therapy: Secondary | ICD-10-CM | POA: Insufficient documentation

## 2012-11-07 DIAGNOSIS — E119 Type 2 diabetes mellitus without complications: Secondary | ICD-10-CM | POA: Insufficient documentation

## 2012-11-07 DIAGNOSIS — I10 Essential (primary) hypertension: Secondary | ICD-10-CM | POA: Insufficient documentation

## 2012-11-07 DIAGNOSIS — M19019 Primary osteoarthritis, unspecified shoulder: Secondary | ICD-10-CM | POA: Insufficient documentation

## 2012-11-07 DIAGNOSIS — N289 Disorder of kidney and ureter, unspecified: Secondary | ICD-10-CM

## 2012-11-07 LAB — CBC
Hemoglobin: 12.1 g/dL (ref 12.0–15.0)
MCH: 32.2 pg (ref 26.0–34.0)
MCHC: 33.5 g/dL (ref 30.0–36.0)
MCV: 96 fL (ref 78.0–100.0)
RBC: 3.76 MIL/uL — ABNORMAL LOW (ref 3.87–5.11)

## 2012-11-07 LAB — URINALYSIS, ROUTINE W REFLEX MICROSCOPIC
Bilirubin Urine: NEGATIVE
Hgb urine dipstick: NEGATIVE
Ketones, ur: NEGATIVE mg/dL
Nitrite: NEGATIVE
Protein, ur: 30 mg/dL — AB
pH: 5 (ref 5.0–8.0)

## 2012-11-07 LAB — URINE MICROSCOPIC-ADD ON

## 2012-11-07 LAB — BASIC METABOLIC PANEL
CO2: 24 mEq/L (ref 19–32)
Calcium: 10.1 mg/dL (ref 8.4–10.5)
Creatinine, Ser: 2.82 mg/dL — ABNORMAL HIGH (ref 0.50–1.10)
GFR calc non Af Amer: 14 mL/min — ABNORMAL LOW (ref >90)
Glucose, Bld: 163 mg/dL — ABNORMAL HIGH (ref 70–99)

## 2012-11-07 LAB — POCT I-STAT TROPONIN I: Troponin i, poc: 0 ng/mL (ref 0.00–0.08)

## 2012-11-07 LAB — GLUCOSE, CAPILLARY: Glucose-Capillary: 147 mg/dL — ABNORMAL HIGH (ref 70–99)

## 2012-11-07 MED ORDER — SODIUM CHLORIDE 0.9 % IV BOLUS (SEPSIS)
500.0000 mL | Freq: Once | INTRAVENOUS | Status: AC
  Administered 2012-11-07: 500 mL via INTRAVENOUS

## 2012-11-07 NOTE — ED Notes (Signed)
Discharge instructions reviewed. Pt verbalized understanding.  

## 2012-11-07 NOTE — ED Notes (Addendum)
Pt reports for 2 days she has had some weakness in her left leg. States that when she tries to walk her leg just feels weak. Denies any pain or SOB. No neuro deficits noted at triage. Pt A&Ox4. Skin warm and dry. Also reports tightness in her left arm.

## 2012-11-07 NOTE — ED Provider Notes (Signed)
CSN: [id number]     Arrival date & time 11/07/12  1510 History   First MD Initiated Contact with Patient 11/07/12 1636     Chief Complaint  Patient presents with  . Extremity Weakness   (Consider location/radiation/quality/duration/timing/severity/associated sxs/prior Treatment) HPI Patient presents with several complaints. Her primary complaint is pain in weakness to the muscles the left shoulder. This has worsened over the past few days. She denies any trauma to the area. She denies any hand weakness or numbness. She states she feels mostly when she abducts the left shoulder. She also has experienced this pain in the shoulder when she does this movement. She's had no fevers or chills. She denies any nausea or vomiting. She denies any urinary symptoms. Her caretaker also states that she's been having increasing gradual, generalized weakness. She no longer is a to walk without assistance. She has known renal insufficiency which has gradually been increasing. She has not seen a nephrologist about this. Past Medical History  Diagnosis Date  . Hypertension   . Diabetes mellitus    Past Surgical History  Procedure Laterality Date  . Back surgery     History reviewed. No pertinent family history. History  Substance Use Topics  . Smoking status: Not on file  . Smokeless tobacco: Not on file  . Alcohol Use: No   OB History   Grav Para Term Preterm Abortions TAB SAB Ect Mult Living                 Review of Systems  Constitutional: Positive for fatigue. Negative for fever and chills.  Respiratory: Negative for cough and shortness of breath.   Cardiovascular: Negative for chest pain.  Gastrointestinal: Negative for nausea, vomiting, abdominal pain and diarrhea.  Genitourinary: Negative for dysuria, frequency and flank pain.  Musculoskeletal: Positive for arthralgias, gait problem and myalgias. Negative for neck pain and neck stiffness.  Skin: Negative for pallor, rash and wound.   Neurological: Positive for weakness (generalized). Negative for dizziness, light-headedness, numbness and headaches.  All other systems reviewed and are negative.    Allergies  Review of patient's allergies indicates no known allergies.  Home Medications   Current Outpatient Rx  Name  Route  Sig  Dispense  Refill  . glipiZIDE (GLUCOTROL XL) 2.5 MG 24 hr tablet   Oral   Take 5 mg by mouth daily.         . naproxen sodium (ANAPROX) 220 MG tablet   Oral   Take 440 mg by mouth 2 (two) times daily as needed (for pain).         [name] olmesartan-hydrochlorothiazide (BENICAR HCT) 40-25 MG per tablet   Oral   Take 1 tablet by mouth daily.          BP 153/79  Pulse 90  Temp(Src) 98 F (36.7 C) (Oral)  SpO2 96% Physical Exam  Nursing note and vitals reviewed. Constitutional: She is oriented to person, place, and time. She appears well-developed and well-nourished. No distress.  HENT:  Head: Normocephalic and atraumatic.  Mouth/Throat: Oropharynx is clear and moist. No oropharyngeal exudate.  Eyes: EOM are normal. Pupils are equal, round, and reactive to light.  Neck: Normal range of motion. Neck supple.  No posterior cervical midline tenderness. He has full range of motion of her neck.  Cardiovascular: Normal rate and regular rhythm.  Exam reveals no gallop and no friction rub.   No murmur heard. Pulmonary/Chest: Effort normal and breath sounds normal. No respiratory distress. She has  no wheezes. She has no rales. She exhibits no tenderness.  Abdominal: Soft. Bowel sounds are normal. She exhibits no distension and no mass. There is no tenderness. There is no rebound and no guarding.  Musculoskeletal: Normal range of motion. She exhibits no edema and no tenderness.  Patient with pain in the left shoulder when abducting against resistance. She has good distal pulses in all extremities. She has no crepitance or deformity over the left shoulder.  Neurological: She is alert and  oriented to person, place, and time.  Patient is alert and oriented x3 with clear, goal oriented speech. Patient has 5/5 motor in all extremities. Sensation is intact to light touch. Bilateral finger-to-nose is normal with no signs of dysmetria.    Skin: Skin is warm and dry. No rash noted. No erythema.  Psychiatric: She has a normal mood and affect. Her behavior is normal.    ED Course  Procedures (including critical care time) Labs Review Labs Reviewed  CBC - Abnormal; Notable for the following:    RBC 3.76 (*)    All other components within normal limits  BASIC METABOLIC PANEL - Abnormal; Notable for the following:    Glucose, Bld 163 (*)    BUN 50 (*)    Creatinine, Ser 2.82 (*)    GFR calc non Af Amer 14 (*)    GFR calc Af Amer 16 (*)    All other components within normal limits  GLUCOSE, CAPILLARY - Abnormal; Notable for the following:    Glucose-Capillary 147 (*)    All other components within normal limits  URINALYSIS, ROUTINE W REFLEX MICROSCOPIC - Abnormal; Notable for the following:    APPearance CLOUDY (*)    Protein, ur 30 (*)    Leukocytes, UA SMALL (*)    All other components within normal limits  PRO B NATRIURETIC PEPTIDE  URINE MICROSCOPIC-ADD ON  POCT I-STAT TROPONIN I   Imaging Review Ct Head Wo Contrast  11/07/2012   CLINICAL DATA:  Extremity weakness, left leg.  EXAM: CT HEAD WITHOUT CONTRAST  TECHNIQUE: Contiguous axial images were obtained from the base of the skull through the vertex without intravenous contrast.  COMPARISON:  None.  FINDINGS: There is atrophy and chronic small vessel disease changes. No acute intracranial abnormality. Specifically, no hemorrhage, hydrocephalus, mass lesion, acute infarction, or significant intracranial injury. No acute calvarial abnormality.  Diffuse mucosal thickening throughout the paranasal sinuses. Mastoids are clear.  IMPRESSION: No acute intracranial abnormality.  Atrophy, chronic microvascular disease.  Chronic  sinusitis.   Electronically Signed   By: Charlett Nose M.D.   On: 11/07/2012 19:26   Dg Shoulder Left  11/07/2012   CLINICAL DATA:  Left shoulder pain and limited range of motion.  EXAM: LEFT SHOULDER - 2+ VIEW  COMPARISON:  None.  FINDINGS: Deep joint spaces are maintained. Mild degenerative changes. No acute fracture the left lung is clear.  IMPRESSION: Degenerative changes but no acute bony findings.   Electronically Signed   By: Loralie Champagne M.D.   On: 11/07/2012 18:25    EKG Interpretation     Ventricular Rate:  94 PR Interval:  162 QRS Duration: 84 QT Interval:  334 QTC Calculation: 417 R Axis:   64 Text Interpretation:  Normal sinus rhythm Nonspecific ST and T wave abnormality Abnormal ECG            MDM    This is his feeling much better after the IV fluids. He is asking to be discharged home.  Patient has known renal insufficiency. It is slightly worse today. Advised both the patient and caretaker then to followup with primary Dr. probably need a referral to a nephrologist. X-ray of the left shoulder shows degenerative disease. This is likely cause for her left shoulder pain. No acute findings seen on CT of her head. Return precautions have been given.  Loren Racer, MD 11/07/12 2015

## 2012-11-07 NOTE — ED Notes (Addendum)
Per daughter, pt has not been eating normally, also has had some increased weakness x 1 wk and left shoulder pain. Pt denies any fevers or any other sx. Pt hard of hearing. Daughter states pt is able to keep fluids down, and they have began to give her ensure to drink. Daughter also states her sight has been getting worse. Pt has been unable to get an appt with her eye dr.

## 2012-12-03 ENCOUNTER — Encounter: Payer: Self-pay | Admitting: Neurology

## 2012-12-05 ENCOUNTER — Encounter (INDEPENDENT_AMBULATORY_CARE_PROVIDER_SITE_OTHER): Payer: Self-pay

## 2012-12-05 ENCOUNTER — Ambulatory Visit (INDEPENDENT_AMBULATORY_CARE_PROVIDER_SITE_OTHER): Payer: Medicare Other | Admitting: Neurology

## 2012-12-05 ENCOUNTER — Encounter: Payer: Self-pay | Admitting: Neurology

## 2012-12-05 VITALS — BP 139/78 | HR 80 | Wt 118.0 lb

## 2012-12-05 DIAGNOSIS — F09 Unspecified mental disorder due to known physiological condition: Secondary | ICD-10-CM

## 2012-12-05 DIAGNOSIS — R2681 Unsteadiness on feet: Secondary | ICD-10-CM

## 2012-12-05 DIAGNOSIS — R5381 Other malaise: Secondary | ICD-10-CM | POA: Insufficient documentation

## 2012-12-05 DIAGNOSIS — R269 Unspecified abnormalities of gait and mobility: Secondary | ICD-10-CM

## 2012-12-05 DIAGNOSIS — R4189 Other symptoms and signs involving cognitive functions and awareness: Secondary | ICD-10-CM | POA: Insufficient documentation

## 2012-12-05 DIAGNOSIS — R202 Paresthesia of skin: Secondary | ICD-10-CM

## 2012-12-05 DIAGNOSIS — R209 Unspecified disturbances of skin sensation: Secondary | ICD-10-CM

## 2012-12-05 DIAGNOSIS — R531 Weakness: Secondary | ICD-10-CM

## 2012-12-05 NOTE — Progress Notes (Signed)
[facility]    Provider:  Dr Hosie Poisson Referring Provider: Dorothyann Peng, MD Primary Care Physician:  Gwynneth Aliment, MD  CC:  weakness  HPI:  Kathleen Weeks is a 79 y.o. female here as a referral from Dr. Allyne Gee for weakness  Patient's daughter provides the majority history as patient is unable to provide much history. Having generalized weakness, most notable in her left shoulder. They feel it has been getting progressively worse for the past 2 months. Feels weak in the legs and arms. Daughter describes it as a generalized weakness. They note she has poor oral intake. Has had a lot of weight loss in the past 6 months. No appetite. No difficulty swallowing. Daughter notes that she used to be able walk with a cane but now cannot do that. Has had multiple falls in the past few months. Family denies any noted facial droop, no word finding/speaking difficulties. Denies any neck pain. Notes a lot of L shoulder pain, told she has arthritis.   Has a cough, no other recent colds or illnesses. No rashes. No EtOH or tobacco usage.   Reviewed head CT imaging which was unremarkable.  Review of Systems: Out of a complete 14 system review, the patient complains of only the following symptoms, and all other reviewed systems are negative. Positive for blurred vision loss of vision weight loss fatigue weakness decreased energy change in appetite  History   Social History  . Marital Status: Widowed    Spouse Name: N/A    Number of Children: 5  . Years of Education: 12+   Occupational History  . Not on file.   Social History Main Topics  . Smoking status: Former Games developer  . Smokeless tobacco: Never Used  . Alcohol Use: No  . Drug Use: No  . Sexual Activity: Not on file   Other Topics Concern  . Not on file   Social History Narrative   Patient lives at home with her daughter.    Patient is retired.    Patient has 5 children.    Patient has a high school education.       No family history on file.  Past Medical History  Diagnosis Date  . Hypertension   . Diabetes mellitus     Past Surgical History  Procedure Laterality Date  . Back surgery      Current Outpatient Prescriptions  Medication Sig Dispense Refill  . Ibuprofen (ADVIL PO) Take by mouth.       No current facility-administered medications for this visit.    Allergies as of 12/05/2012  . (No Known Allergies)    Vitals: BP 139/78  Pulse 80  Wt 118 lb (53.524 kg) Last Weight:  Wt Readings from Last 1 Encounters:  12/05/12 118 lb (53.524 kg)   Last Height:   Ht Readings from Last 1 Encounters:  No data found for Ht     Physical exam: Exam: Gen: NAD, conversant Eyes: anicteric sclerae, moist conjunctivae HENT: Atraumatic, oropharynx clear Neck: Trachea midline; supple,  Lungs: CTA, no wheezing, rales, rhonic                          CV: RRR, no MRG Abdomen: Soft, non-tender;  Extremities: No peripheral edema  Skin: Normal temperature, no rash,  Psych: Appropriate affect, pleasant  Neuro: MS: Alert oriented to name and location only. Unable to follow simple one-step commands. Limited verbal output no apparent errors  CN: PERRL, EOMI  no nystagmus, no ptosis, sensation intact to LT V1-V3 bilat, mildly decreased with left nasolabial fold, palate elevates symmetrically, shoulder shrug 5/5 bilat,  tongue protrudes midline, no fasiculations noted.  Motor: normal bulk and tone Do to inability to follow instructions unable to formally test muscle strength. There is to move right upper tremor he and bilateral lower extremities symmetrically and against light resistance. Decreased range of motion proximal left upper extremity but strength appears overall intact  Coord: Unable to assess to 2 patient unable to follow instructions   Reflexes: symmetrical, bilat downgoing toes  Sens: LT intact in all extremities  Gait: Patient in wheelchair, requires assistance to stand,  severely stooped leaning forward occurs assistance did not fall over. Small unsteady steps   Assessment:  After physical and neurologic examination, review of laboratory studies, imaging, neurophysiology testing and pre-existing records, assessment will be reviewed on the problem list.  Plan:  Treatment plan and additional workup will be reviewed under Problem List.  1)Gait instability 2)weakness 3)Cognitive decline  79 year old woman presented for initial evaluation of progressive generalized weakness, impaired oral intake weight loss for the past 2 months. Physical exam notable for diminished cognitive function, impaired range of motion weakness in the left upper extremity and severe gait instability. Unclear etiology the symptoms. Suspect they're likely have been going on longer than a few months as indicated. Will check brain MRI and cervical spine, check lab workup. Have patient followup once workup completed. Patient will likely benefit from physical therapy. Check MOCA at next visit.

## 2012-12-05 NOTE — Patient Instructions (Signed)
Overall you are doing fairly well but I do want to suggest a few things today:   We will check some blood work today and order a MRI of the brain and cervical spine  We will see you back once the workup is completed, sooner if we need to. Please call us with any interim questions, concerns, problems, updates or refill requests.   Please also call us for any test results so we can go over those with you on the phone.  My clinical assistant and will answer any of your questions and relay your messages to me and also relay most of my messages to you.   Our phone number is [phone]. We also have an after hours call service for urgent matters and there is a physician on-call for urgent questions. For any emergencies you know to call 911 or go to the nearest emergency room

## 2012-12-10 LAB — CK: Total CK: 34 U/L (ref 24–173)

## 2012-12-10 LAB — PROTEIN ELECTROPHORESIS
A/G Ratio: 1 (ref 0.7–2.0)
Albumin ELP: 3.9 g/dL (ref 3.2–5.6)
Gamma Globulin: 1 g/dL (ref 0.5–1.6)

## 2012-12-10 LAB — VITAMIN B12: Vitamin B-12: 1014 pg/mL — ABNORMAL HIGH (ref 211–946)

## 2012-12-10 LAB — TSH: TSH: 1.02 u[IU]/mL (ref 0.450–4.500)

## 2012-12-10 LAB — METHYLMALONIC ACID, SERUM: Methylmalonic Acid: 375 nmol/L (ref 0–378)

## 2012-12-17 ENCOUNTER — Ambulatory Visit
Admission: RE | Admit: 2012-12-17 | Discharge: 2012-12-17 | Disposition: A | Discharge status: Home / Self Care | Payer: Medicare Other | Source: Ambulatory Visit | Attending: Neurology | Admitting: Neurology

## 2012-12-17 DIAGNOSIS — R202 Paresthesia of skin: Secondary | ICD-10-CM

## 2012-12-17 DIAGNOSIS — R531 Weakness: Secondary | ICD-10-CM

## 2012-12-17 DIAGNOSIS — R2681 Unsteadiness on feet: Secondary | ICD-10-CM

## 2013-01-06 ENCOUNTER — Ambulatory Visit: Payer: Medicare Other | Admitting: Neurology

## 2013-04-07 ENCOUNTER — Encounter (HOSPITAL_COMMUNITY): Payer: Self-pay | Admitting: Emergency Medicine

## 2013-04-07 ENCOUNTER — Emergency Department (HOSPITAL_COMMUNITY)
Admission: EM | Admit: 2013-04-07 | Discharge: 2013-04-07 | Disposition: A | Discharge status: Home / Self Care | Payer: Medicare Other | Attending: Emergency Medicine | Admitting: Emergency Medicine

## 2013-04-07 ENCOUNTER — Emergency Department (HOSPITAL_COMMUNITY): Payer: Medicare Other

## 2013-04-07 DIAGNOSIS — R059 Cough, unspecified: Secondary | ICD-10-CM

## 2013-04-07 DIAGNOSIS — I10 Essential (primary) hypertension: Secondary | ICD-10-CM

## 2013-04-07 DIAGNOSIS — M25519 Pain in unspecified shoulder: Secondary | ICD-10-CM | POA: Insufficient documentation

## 2013-04-07 DIAGNOSIS — R05 Cough: Secondary | ICD-10-CM

## 2013-04-07 DIAGNOSIS — Z87891 Personal history of nicotine dependence: Secondary | ICD-10-CM | POA: Insufficient documentation

## 2013-04-07 DIAGNOSIS — Z79899 Other long term (current) drug therapy: Secondary | ICD-10-CM | POA: Insufficient documentation

## 2013-04-07 DIAGNOSIS — R062 Wheezing: Secondary | ICD-10-CM | POA: Insufficient documentation

## 2013-04-07 DIAGNOSIS — G8929 Other chronic pain: Secondary | ICD-10-CM | POA: Insufficient documentation

## 2013-04-07 DIAGNOSIS — J069 Acute upper respiratory infection, unspecified: Secondary | ICD-10-CM

## 2013-04-07 DIAGNOSIS — E119 Type 2 diabetes mellitus without complications: Secondary | ICD-10-CM | POA: Insufficient documentation

## 2013-04-07 LAB — CBC
HEMATOCRIT: 32.5 % — AB (ref 36.0–46.0)
HEMOGLOBIN: 10.7 g/dL — AB (ref 12.0–15.0)
MCH: 31.8 pg (ref 26.0–34.0)
MCHC: 32.9 g/dL (ref 30.0–36.0)
MCV: 96.7 fL (ref 78.0–100.0)
Platelets: 249 10*3/uL (ref 150–400)
RBC: 3.36 MIL/uL — AB (ref 3.87–5.11)
RDW: 13 % (ref 11.5–15.5)
WBC: 5.1 10*3/uL (ref 4.0–10.5)

## 2013-04-07 LAB — BASIC METABOLIC PANEL
BUN: 21 mg/dL (ref 6–23)
CHLORIDE: 101 meq/L (ref 96–112)
CO2: 26 mEq/L (ref 19–32)
Calcium: 9.2 mg/dL (ref 8.4–10.5)
Creatinine, Ser: 1.16 mg/dL — ABNORMAL HIGH (ref 0.50–1.10)
GFR calc Af Amer: 47 mL/min — ABNORMAL LOW (ref >90)
GFR calc non Af Amer: 41 mL/min — ABNORMAL LOW (ref >90)
GLUCOSE: 192 mg/dL — AB (ref 70–99)
POTASSIUM: 4.2 meq/L (ref 3.7–5.3)
Sodium: 140 mEq/L (ref 137–147)

## 2013-04-07 NOTE — ED Provider Notes (Signed)
CSN: [id number]     Arrival date & time 04/07/13  1152 History   First MD Initiated Contact with Patient 04/07/13 1524     Chief Complaint  Patient presents with  . Cough  . Shoulder Pain    (Consider location/radiation/quality/duration/timing/severity/associated sxs/prior Treatment) HPI Comments: Patient is an 80 y/o female with a hx of HTN and DM who presents for cough. Daughter states that cough has been present for "years" and always sounds congested. Daughter states cough is nonproductive and patient "always swallows (what comes up)". Patient states she has been experiencing nasal congestion and rhinorrhea recently which is new for her. Patient has been taking tessalon for cough without relief; prescribed by her PCP. Daughter states that lately patient has had trouble sleeping. Patient and daughter deny fever, CP, SOB, vomiting, extremity numbness/tingling, and weakness. She is not on chronic home O2. No hx of ACS.  Patient also with c/o L shoulder pain. Pain is nonradiating and has been present for "months". No trauma or injury inciting pain. Patient denies any aggravating factors, though symptoms appear at bedside to be aggravated by movement. Patient states she has taken ibuprofen, aleve, or aspirin for symptoms with temporary moderate relief; state pain gradually returns after a few hours. Patient states she thinks she has arthritis. She denies numbness/tingling, pallor, and fever associated with symptoms.  PCP - Dr. Dorothyann Peng.  Patient is a 80 y.o. female presenting with cough and shoulder pain. The history is provided by the patient and a relative. No language interpreter was used.  Cough Associated symptoms: no chest pain, no fever and no shortness of breath   Shoulder Pain Associated symptoms include arthralgias and coughing. Pertinent negatives include no abdominal pain, chest pain, fever, nausea, numbness, vomiting or weakness.    Past Medical History  Diagnosis Date  .  Hypertension   . Diabetes mellitus    Past Surgical History  Procedure Laterality Date  . Back surgery     No family history on file. History  Substance Use Topics  . Smoking status: Former Games developer  . Smokeless tobacco: Never Used  . Alcohol Use: No   OB History   Grav Para Term Preterm Abortions TAB SAB Ect Mult Living                 Review of Systems  Constitutional: Negative for fever.  Respiratory: Positive for cough. Negative for shortness of breath.   Cardiovascular: Negative for chest pain.  Gastrointestinal: Negative for nausea, vomiting and abdominal pain.  Musculoskeletal: Positive for arthralgias.  Skin: Negative for pallor.  Neurological: Negative for syncope, weakness and numbness.  All other systems reviewed and are negative.     Allergies  Review of patient's allergies indicates no known allergies.  Home Medications   Current Outpatient Rx  Name  Route  Sig  Dispense  Refill  . Ibuprofen (ADVIL PO)   Oral   Take by mouth.          BP 170/78  Pulse 84  Temp(Src) 97.9 F (36.6 C) (Oral)  Resp 16  SpO2 92%  Physical Exam  Nursing note and vitals reviewed. Constitutional: She is oriented to person, place, and time. She appears well-developed and well-nourished. No distress.  Patient pleasant and well and nontoxic/nonseptic appearing. Patient in NAD.  HENT:  Head: Normocephalic and atraumatic.  Mouth/Throat: Oropharynx is clear and moist. No oropharyngeal exudate.  Eyes: Conjunctivae and EOM are normal. Pupils are equal, round, and reactive to light. No scleral icterus.  Neck: Normal range of motion.  Cardiovascular: Normal rate, regular rhythm, normal heart sounds and intact distal pulses.   Distal radial pulses 2+ b/l  Pulmonary/Chest: Effort normal. No respiratory distress. She has wheezes. She has no rales. She exhibits no tenderness.  Mild diffuse expiratory wheezing and rhonchi on auscultation. No rales. No tachypnea, retractions or  accessory muscle use. Chest expansion symmetric. Congested nonproductive cough appreciated at bedside.  Abdominal: Soft. She exhibits no distension. There is no tenderness.  Musculoskeletal:       Left shoulder: She exhibits decreased range of motion (secondary to pain), tenderness, bony tenderness and decreased strength (4/5 against resistance of LUE; 5/5 against resistance of RUE). She exhibits no swelling, no effusion, no crepitus, no deformity and normal pulse.       Cervical back: Normal.  Slight decreased ROM of L shoulder with abduction secondary to discomfort. Good joint integrity. No deformities or crepitus. No swelling, effusions, erythema, or heat to touch. Equal grip strength b/l.   Neurological: She is alert and oriented to person, place, and time. No sensory deficit. GCS eye subscore is 4. GCS verbal subscore is 5. GCS motor subscore is 6.  No gross sensory deficits appreciated.  Skin: Skin is warm and dry. No rash noted. She is not diaphoretic. No erythema. No pallor.  Psychiatric: She has a normal mood and affect. Her behavior is normal.    ED Course  Procedures (including critical care time) Labs Review Labs Reviewed  CBC - Abnormal; Notable for the following:    RBC 3.36 (*)    Hemoglobin 10.7 (*)    HCT 32.5 (*)    All other components within normal limits  BASIC METABOLIC PANEL - Abnormal; Notable for the following:    Glucose, Bld 192 (*)    Creatinine, Ser 1.16 (*)    GFR calc non Af Amer 41 (*)    GFR calc Af Amer 47 (*)    All other components within normal limits   Imaging Review Dg Chest 2 View  04/07/2013   CLINICAL DATA:  COUGH SHOULDER PAIN  EXAM: CHEST  2 VIEW  COMPARISON:  DG CHEST 2 VIEW dated 02/03/2004  FINDINGS: The cardiac silhouette is mild-to-moderately enlarged. Aorta is tortuous. No focal regions of consolidation or focal infiltrates appreciated. Linear area of increased density projects within the right upper lobe region. The density of this  finding is appearance of region calcium possibly sequela of prior iatrogenic intervention. S-shaped scoliosis appreciated within the thoracic spine. No acute abnormalities identified.  IMPRESSION: No evidence of acute cardiopulmonary disease.   Electronically Signed   By: Salome Holmes M.D.   On: 04/07/2013 13:09     EKG Interpretation   Date/Time:  Tuesday April 07 2013 12:05:46 EDT Ventricular Rate:  79 PR Interval:  184 QRS Duration: 84 QT Interval:  392 QTC Calculation: 449 R Axis:   59 Text Interpretation:  Normal sinus rhythm Nonspecific T wave abnormality  Abnormal ECG No significant change since last tracing Confirmed by  Ethelda Chick  MD, SAM [phone]) on 04/07/2013 4:43:13 PM      MDM   Final diagnoses:  URI (upper respiratory infection)  Cough  Hypertension    80 y/o female with hx of HTN and DM presents for multiple complaints. Patient with nasal congestion and rhinorrhea x 2 days. Symptoms associated with cough which daughter endorses to be chronic. Patient also c/o L shoulder pain. Patient has been experiencing this pain for "months" and was also seen in  ED for same in 10/2012.  Patient well and nontoxic appearing, afebrile, and hemodynamically stable today. She is pleasant and well and nontoxic/nonseptic appearing. Patient neurovascularly intact on physical exam. No gross sensory deficits appreciated. No new trauma or injury and no evidence of septic joint on examination. WRT upper respiratory symptoms, patient has no leukocytosis or evidence of PNA on CXR. No tachypnea, dyspnea, or hypoxia. No retractions or accessory muscle use. EKG today shows NSR without ischemic change; no change since last tracing.  Do not believe further emergent work up is indicated at this time. Majority of complaints today are chronic in nature and can be followed by patient's PCP. No indication to start abx today for URI symptoms. Patient stable and appropriate for d/c with instruction to f/u with  her PCP by Friday. Return precautions provided and patient agreeable to plan with no unaddressed concerns. Patient seen also by Dr. Ethelda ChickJacubowitz who is in agreement with this work up, assessment, management plan, and patient's stability for d/c.   Filed Vitals:   04/07/13 1545 04/07/13 1550 04/07/13 1557 04/07/13 1601  BP: 145/107  145/107   Pulse: 81  82   Temp:   98.2 F (36.8 C) 98.5 F (36.9 C)  TempSrc:   Oral Rectal  Resp: 21  20   SpO2: 100% 100% 100%        Antony MaduraKelly Ruthe Roemer, PA-C 04/07/13 1655

## 2013-04-07 NOTE — Discharge Instructions (Signed)
Follow up with your primary care doctor by Friday for a symptom recheck. Recommend continued use of ibuprofen or tylenol for shoulder pain. Return if symptoms worsen such as if you develop a fever, shortness of breath, loss of consciousness, or any of the symptoms listed below.  Upper Respiratory Infection, Adult An upper respiratory infection (URI) is also known as the common cold. It is often caused by a type of germ (virus). Colds are easily spread (contagious). You can pass it to others by kissing, coughing, sneezing, or drinking out of the same glass. Usually, you get better in 1 or 2 weeks.  HOME CARE   Only take medicine as told by your doctor.  Use a warm mist humidifier or breathe in steam from a hot shower.  Drink enough water and fluids to keep your pee (urine) clear or pale yellow.  Get plenty of rest.  Return to work when your temperature is back to normal or as told by your doctor. You may use a face mask and wash your hands to stop your cold from spreading. GET HELP RIGHT AWAY IF:   After the first few days, you feel you are getting worse.  You have questions about your medicine.  You have chills, shortness of breath, or brown or red spit (mucus).  You have yellow or brown snot (nasal discharge) or pain in the face, especially when you bend forward.  You have a fever, puffy (swollen) neck, pain when you swallow, or white spots in the back of your throat.  You have a bad headache, ear pain, sinus pain, or chest pain.  You have a high-pitched whistling sound when you breathe in and out (wheezing).  You have a lasting cough or cough up blood.  You have sore muscles or a stiff neck. MAKE SURE YOU:   Understand these instructions.  Will watch your condition.  Will get help right away if you are not doing well or get worse. Document Released: 06/27/2007 Document Revised: 04/02/2011 Document Reviewed: 05/15/2010 [facility]ExitCare Patient Information 2014 [place]Care,  [place].  Cough, Adult  A cough is a reflex. It helps you clear your throat and airways. A cough can help heal your body. A cough can last 2 or 3 weeks (acute) or may last more than 8 weeks (chronic). Some common causes of a cough can include an infection, allergy, or a cold. HOME CARE  Only take medicine as told by your doctor.  If given, take your medicines (antibiotics) as told. Finish them even if you start to feel better.  Use a cold steam vaporizer or humidier in your home. This can help loosen thick spit (secretions).  Sleep so you are almost sitting up (semi-upright). Use pillows to do this. This helps reduce coughing.  Rest as needed.  Stop smoking if you smoke. GET HELP RIGHT AWAY IF:  You have yellowish-white fluid (pus) in your thick spit.  Your cough gets worse.  Your medicine does not reduce coughing, and you are losing sleep.  You cough up blood.  You have trouble breathing.  Your pain gets worse and medicine does not help.  You have a fever. MAKE SURE YOU:   Understand these instructions.  Will watch your condition.  Will get help right away if you are not doing well or get worse. Document Released: 09/21/2010 Document Revised: 04/02/2011 Document Reviewed: 09/21/2010 [facility]Exit[facility] Patient Information 2014 [place], [place].

## 2013-04-07 NOTE — ED Provider Notes (Signed)
Patient with cough and rhinorrhea x2 days. Patient states "I feel fine" on exam distresss. Lungs clear auscultation. Do not feel that antibiotics would benefit this patient no fever normal chest x-ray normal pulse oximetry normal respiratory rate lungs clear auscultation  Doug SouSam Rubee Vega, MD 04/08/13 [phone]

## 2013-04-07 NOTE — ED Notes (Signed)
MD at bedside.EDP 

## 2013-04-07 NOTE — ED Notes (Signed)
Pt is here with cough and family states she swallows it.  Pt has left shoulder pain that is chronic per family.  Pt not sleeping

## 2013-04-08 NOTE — ED Provider Notes (Signed)
Medical screening examination/treatment/procedure(s) were conducted as a shared visit with non-physician practitioner(s) and myself.  I personally evaluated the patient during the encounter.   EKG Interpretation   Date/Time:  Tuesday April 07 2013 12:05:46 EDT Ventricular Rate:  79 PR Interval:  184 QRS Duration: 84 QT Interval:  392 QTC Calculation: 449 R Axis:   59 Text Interpretation:  Normal sinus rhythm Nonspecific T wave abnormality  Abnormal ECG No significant change since last tracing Confirmed by  Ethelda ChickJACUBOWITZ  MD, Marquize Seib [phone]) on 04/07/2013 4:43:13 PM       Doug SouSam Tannah Dreyfuss, MD 04/08/13 [id number]

## 2013-04-18 ENCOUNTER — Emergency Department (HOSPITAL_BASED_OUTPATIENT_CLINIC_OR_DEPARTMENT_OTHER)
Admission: EM | Admit: 2013-04-18 | Discharge: 2013-04-18 | Disposition: A | Discharge status: Home / Self Care | Payer: Medicare Other | Attending: Emergency Medicine | Admitting: Emergency Medicine

## 2013-04-18 ENCOUNTER — Encounter (HOSPITAL_BASED_OUTPATIENT_CLINIC_OR_DEPARTMENT_OTHER): Payer: Self-pay | Admitting: Emergency Medicine

## 2013-04-18 DIAGNOSIS — R05 Cough: Secondary | ICD-10-CM | POA: Insufficient documentation

## 2013-04-18 DIAGNOSIS — Z79899 Other long term (current) drug therapy: Secondary | ICD-10-CM | POA: Insufficient documentation

## 2013-04-18 DIAGNOSIS — E119 Type 2 diabetes mellitus without complications: Secondary | ICD-10-CM | POA: Insufficient documentation

## 2013-04-18 DIAGNOSIS — H60399 Other infective otitis externa, unspecified ear: Secondary | ICD-10-CM | POA: Insufficient documentation

## 2013-04-18 DIAGNOSIS — Z87891 Personal history of nicotine dependence: Secondary | ICD-10-CM | POA: Insufficient documentation

## 2013-04-18 DIAGNOSIS — M129 Arthropathy, unspecified: Secondary | ICD-10-CM | POA: Insufficient documentation

## 2013-04-18 DIAGNOSIS — H6091 Unspecified otitis externa, right ear: Secondary | ICD-10-CM

## 2013-04-18 DIAGNOSIS — R059 Cough, unspecified: Secondary | ICD-10-CM | POA: Insufficient documentation

## 2013-04-18 DIAGNOSIS — I10 Essential (primary) hypertension: Secondary | ICD-10-CM | POA: Insufficient documentation

## 2013-04-18 HISTORY — DX: Unspecified osteoarthritis, unspecified site: M19.90

## 2013-04-18 MED ORDER — NEOMYCIN-POLYMYXIN-HC 3.5-10000-1 OT SUSP: 4.0000 [drp] | Freq: Four times a day (QID) | OTIC | Status: DC

## 2013-04-18 MED ORDER — CIPROFLOXACIN HCL 500 MG PO TABS: 500.0000 mg | ORAL_TABLET | Freq: Two times a day (BID) | ORAL | Status: DC

## 2013-04-18 NOTE — ED Notes (Signed)
Dr. Delo at bedside. 

## 2013-04-18 NOTE — ED Notes (Signed)
Right ear pain and drainage since thursday

## 2013-04-18 NOTE — Discharge Instructions (Signed)
Cipro as prescribed. Cortisporin drops 4 times daily in the right ear.  Return to the ER if your symptoms substantially worsen, and follow up with your doctor if not improving in the next week.   Otitis Externa Otitis externa is a bacterial or fungal infection of the outer ear canal. This is the area from the eardrum to the outside of the ear. Otitis externa is sometimes called "swimmer's ear." CAUSES  Possible causes of infection include:  Swimming in dirty water.  Moisture remaining in the ear after swimming or bathing.  Mild injury (trauma) to the ear.  Objects stuck in the ear (foreign body).  Cuts or scrapes (abrasions) on the outside of the ear. SYMPTOMS  The first symptom of infection is often itching in the ear canal. Later signs and symptoms may include swelling and redness of the ear canal, ear pain, and yellowish-white fluid (pus) coming from the ear. The ear pain may be worse when pulling on the earlobe. DIAGNOSIS  Your caregiver will perform a physical exam. A sample of fluid may be taken from the ear and examined for bacteria or fungi. TREATMENT  Antibiotic ear drops are often given for 10 to 14 days. Treatment may also include pain medicine or corticosteroids to reduce itching and swelling. PREVENTION   Keep your ear dry. Use the corner of a towel to absorb water out of the ear canal after swimming or bathing.  Avoid scratching or putting objects inside your ear. This can damage the ear canal or remove the protective wax that lines the canal. This makes it easier for bacteria and fungi to grow.  Avoid swimming in lakes, polluted water, or poorly chlorinated pools.  You may use ear drops made of rubbing alcohol and vinegar after swimming. Combine equal parts of white vinegar and alcohol in a bottle. Put 3 or 4 drops into each ear after swimming. HOME CARE INSTRUCTIONS   Apply antibiotic ear drops to the ear canal as prescribed by your caregiver.  Only take  over-the-counter or prescription medicines for pain, discomfort, or fever as directed by your caregiver.  If you have diabetes, follow any additional treatment instructions from your caregiver.  Keep all follow-up appointments as directed by your caregiver. SEEK MEDICAL CARE IF:   You have a fever.  Your ear is still red, swollen, painful, or draining pus after 3 days.  Your redness, swelling, or pain gets worse.  You have a severe headache.  You have redness, swelling, pain, or tenderness in the area behind your ear. MAKE SURE YOU:   Understand these instructions.  Will watch your condition.  Will get help right away if you are not doing well or get worse. Document Released: 01/08/2005 Document Revised: 04/02/2011 Document Reviewed: 01/25/2011 [facility]ExitCare Patient Information 2014 [place] GardensExitCare, [place]LL[place].

## 2013-04-18 NOTE — ED Provider Notes (Signed)
CSN: [id number]     Arrival date & time 04/18/13  1459 History   First MD Initiated Contact with Patient 04/18/13 1521    This chart was scribed for Geoffery Lyonsouglas Aristides Luckey, MD by Marica OtterNusrat Rahman, ED Scribe. This patient was seen in room MH10/MH10 and the patient's care was started at 3:23 PM.  PCP: Gwynneth AlimentSANDERS,ROBYN N, MD   Chief Complaint  Patient presents with  . Ear Drainage   The history is provided by the patient. No language interpreter was used.   HPI Comments: Kathleen Weeks is a 80 y.o. female, with a history of DM, HTN and arthritis, who presents to the Emergency Department complaining of right ear pain with white mucous discharge from the right ear, onset couple of days ago. Pt reports she is not having any trouble hearing out of the right ear. Pt states that earlier today her daughter put in some drops in the right ear and she experienced some relief. Pt reports she had a similar episode 6-7 years ago. Pt complains of associated cough and cold like symptoms. Pt denies any problems with the left ear.   Past Medical History  Diagnosis Date  . Hypertension   . Diabetes mellitus   . Arthritis    Past Surgical History  Procedure Laterality Date  . Back surgery     No family history on file. History  Substance Use Topics  . Smoking status: Former Games developermoker  . Smokeless tobacco: Never Used  . Alcohol Use: No   OB History   Grav Para Term Preterm Abortions TAB SAB Ect Mult Living                 Review of Systems  HENT: Positive for ear pain.   Respiratory: Positive for cough.   All other systems reviewed and are negative.   A complete 10 system review of systems was obtained and all systems are negative except as noted in the HPI and PMH.   Allergies  Review of patient's allergies indicates no known allergies.  Home Medications   Current Outpatient Rx  Name  Route  Sig  Dispense  Refill  . olmesartan-hydrochlorothiazide (BENICAR HCT) 40-25 MG per tablet   Oral   Take 1  tablet by mouth daily.         . benzonatate (TESSALON) 100 MG capsule   Oral   Take 100 mg by mouth 3 (three) times daily as needed for cough.         . naproxen sodium (ANAPROX) 220 MG tablet   Oral   Take 440 mg by mouth 2 (two) times daily as needed (for pain).          BP 148/72  Temp(Src) 99.4 F (37.4 C) (Oral)  Resp 24  SpO2 100% Physical Exam  Nursing note and vitals reviewed. Constitutional: She is oriented to person, place, and time. She appears well-developed and well-nourished. No distress.  HENT:  Head: Normocephalic and atraumatic.  Left TM and ear canal clear. Right TM obscured due to purulent drainage w/in the canal. Some inflammation of the ear canal (r ear).   Eyes: EOM are normal.  Neck: Neck supple. No tracheal deviation present.  Cardiovascular: Normal rate.   Pulmonary/Chest: Effort normal. No respiratory distress.  Musculoskeletal: Normal range of motion.  Neurological: She is alert and oriented to person, place, and time.  Skin: Skin is warm and dry.  Psychiatric: She has a normal mood and affect. Her behavior is normal.  ED Course  Procedures (including critical care time) DIAGNOSTIC STUDIES: Oxygen Saturation is 100% on RA, normal by my interpretation.    COORDINATION OF CARE:  3:26 PM-Discussed treatment plan, which includes meds and f/u with PCP if no improvement, with pt at bedside and pt agreed to plan.   Labs Review Labs Reviewed - No data to display Imaging Review No results found.   EKG Interpretation None      MDM   Final diagnoses:  None    Patient presents with complaints of right ear pain and drainage for the past 2 days. Exam reveals an otitis externa. This will be treated with Cortisporin drops and oral Cipro. She is to return if her symptoms worsen and followup with ENT if not improving in the next several days.  I personally performed the services described in this documentation, which was scribed in my  presence. The recorded information has been reviewed and is accurate.        Geoffery Lyons, MD 04/18/13 [phone]

## 2013-05-02 ENCOUNTER — Encounter (HOSPITAL_BASED_OUTPATIENT_CLINIC_OR_DEPARTMENT_OTHER): Payer: Self-pay | Admitting: Emergency Medicine

## 2013-05-02 ENCOUNTER — Inpatient Hospital Stay (HOSPITAL_BASED_OUTPATIENT_CLINIC_OR_DEPARTMENT_OTHER)
Admission: EM | Admit: 2013-05-02 | Discharge: 2013-05-07 | DRG: 871 | Disposition: A | Discharge status: Home / Self Care | Payer: Medicare Other | Attending: Internal Medicine | Admitting: Internal Medicine

## 2013-05-02 ENCOUNTER — Emergency Department (HOSPITAL_BASED_OUTPATIENT_CLINIC_OR_DEPARTMENT_OTHER): Payer: Medicare Other

## 2013-05-02 DIAGNOSIS — IMO0002 Reserved for concepts with insufficient information to code with codable children: Secondary | ICD-10-CM

## 2013-05-02 DIAGNOSIS — R627 Adult failure to thrive: Secondary | ICD-10-CM | POA: Diagnosis present

## 2013-05-02 DIAGNOSIS — H6690 Otitis media, unspecified, unspecified ear: Secondary | ICD-10-CM

## 2013-05-02 DIAGNOSIS — R531 Weakness: Secondary | ICD-10-CM

## 2013-05-02 DIAGNOSIS — R5381 Other malaise: Secondary | ICD-10-CM | POA: Diagnosis present

## 2013-05-02 DIAGNOSIS — M25559 Pain in unspecified hip: Secondary | ICD-10-CM | POA: Diagnosis present

## 2013-05-02 DIAGNOSIS — R7989 Other specified abnormal findings of blood chemistry: Secondary | ICD-10-CM

## 2013-05-02 DIAGNOSIS — M1990 Unspecified osteoarthritis, unspecified site: Secondary | ICD-10-CM

## 2013-05-02 DIAGNOSIS — G9340 Encephalopathy, unspecified: Secondary | ICD-10-CM | POA: Diagnosis present

## 2013-05-02 DIAGNOSIS — T40605A Adverse effect of unspecified narcotics, initial encounter: Secondary | ICD-10-CM | POA: Diagnosis present

## 2013-05-02 DIAGNOSIS — M25569 Pain in unspecified knee: Secondary | ICD-10-CM

## 2013-05-02 DIAGNOSIS — M1710 Unilateral primary osteoarthritis, unspecified knee: Secondary | ICD-10-CM

## 2013-05-02 DIAGNOSIS — I214 Non-ST elevation (NSTEMI) myocardial infarction: Secondary | ICD-10-CM

## 2013-05-02 DIAGNOSIS — R2681 Unsteadiness on feet: Secondary | ICD-10-CM

## 2013-05-02 DIAGNOSIS — E860 Dehydration: Secondary | ICD-10-CM | POA: Diagnosis present

## 2013-05-02 DIAGNOSIS — R64 Cachexia: Secondary | ICD-10-CM | POA: Diagnosis present

## 2013-05-02 DIAGNOSIS — N179 Acute kidney failure, unspecified: Secondary | ICD-10-CM

## 2013-05-02 DIAGNOSIS — I2489 Other forms of acute ischemic heart disease: Secondary | ICD-10-CM

## 2013-05-02 DIAGNOSIS — H7090 Unspecified mastoiditis, unspecified ear: Secondary | ICD-10-CM | POA: Diagnosis present

## 2013-05-02 DIAGNOSIS — R778 Other specified abnormalities of plasma proteins: Secondary | ICD-10-CM | POA: Diagnosis present

## 2013-05-02 DIAGNOSIS — R6510 Systemic inflammatory response syndrome (SIRS) of non-infectious origin without acute organ dysfunction: Secondary | ICD-10-CM

## 2013-05-02 DIAGNOSIS — D649 Anemia, unspecified: Secondary | ICD-10-CM

## 2013-05-02 DIAGNOSIS — E119 Type 2 diabetes mellitus without complications: Secondary | ICD-10-CM

## 2013-05-02 DIAGNOSIS — I248 Other forms of acute ischemic heart disease: Secondary | ICD-10-CM

## 2013-05-02 DIAGNOSIS — H9190 Unspecified hearing loss, unspecified ear: Secondary | ICD-10-CM | POA: Diagnosis present

## 2013-05-02 DIAGNOSIS — Z87891 Personal history of nicotine dependence: Secondary | ICD-10-CM

## 2013-05-02 DIAGNOSIS — D72829 Elevated white blood cell count, unspecified: Secondary | ICD-10-CM

## 2013-05-02 DIAGNOSIS — H6090 Unspecified otitis externa, unspecified ear: Secondary | ICD-10-CM

## 2013-05-02 DIAGNOSIS — R4189 Other symptoms and signs involving cognitive functions and awareness: Secondary | ICD-10-CM

## 2013-05-02 DIAGNOSIS — R4182 Altered mental status, unspecified: Secondary | ICD-10-CM

## 2013-05-02 DIAGNOSIS — A419 Sepsis, unspecified organism: Secondary | ICD-10-CM

## 2013-05-02 DIAGNOSIS — I10 Essential (primary) hypertension: Secondary | ICD-10-CM

## 2013-05-02 HISTORY — DX: Disorder of kidney and ureter, unspecified: N28.9

## 2013-05-02 MED ORDER — NALOXONE HCL 0.4 MG/ML IJ SOLN
0.4000 mg | Freq: Once | INTRAMUSCULAR | Status: AC
  Administered 2013-05-02: 0.4 mg via NASAL

## 2013-05-02 MED ORDER — NALOXONE HCL 0.4 MG/ML IJ SOLN
INTRAMUSCULAR | Status: AC
  Filled 2013-05-02: qty 1

## 2013-05-02 NOTE — ED Notes (Signed)
Dr Nicanor AlconPalumbo update on pt status

## 2013-05-02 NOTE — ED Provider Notes (Signed)
CSN: [id number]     Arrival date & time 05/02/13  2234 History  This chart was scribed for Elisa Kutner Smitty CordsK Thadd Apuzzo-Rasch, MD by Bronson CurbJacqueline Melvin, ED Scribe. This patient was seen in room MH03/MH03 and the patient's care was started at 11:32 PM.     Chief Complaint  Patient presents with  . Fatigue  . Hip Pain     Patient is a 80 y.o. female presenting with hip pain. The history is provided by a relative. The history is limited by the condition of the patient. No language interpreter was used.  Hip Pain This is a new problem. The current episode started more than 2 days ago. The problem occurs constantly. The problem has not changed since onset.Pertinent negatives include no chest pain, no abdominal pain, no headaches and no shortness of breath. Nothing aggravates the symptoms. Nothing relieves the symptoms. She has tried acetaminophen for the symptoms. The treatment provided no relief.   HPI Comments: Kara DiesMary W Soliday is a 80 y.o. female who presents to the Emergency Department complaining of hip pain that began 3 days ago. There is associated right shoulder pain, decrease in appetite and constipation, wheezing. Patient denies fever, emesis, diarrhea. She has taken Tylenol and Aleve with no relief. She is also taking Vicodin, but does not recall the last dose. Patient has a history of HTN and DM.    PCP Dr. Allyne GeeSanders  Past Medical History  Diagnosis Date  . Hypertension   . Diabetes mellitus   . Arthritis    Past Surgical History  Procedure Laterality Date  . Back surgery     No family history on file. History  Substance Use Topics  . Smoking status: Former Games developermoker  . Smokeless tobacco: Never Used  . Alcohol Use: No   OB History   Grav Para Term Preterm Abortions TAB SAB Ect Mult Living                 Review of Systems  Unable to perform ROS Constitutional: Positive for appetite change (Decrease in appetite). Negative for fever.  Respiratory: Positive for wheezing. Negative for  shortness of breath.   Cardiovascular: Negative for chest pain.  Gastrointestinal: Positive for constipation. Negative for vomiting, abdominal pain and diarrhea.  Musculoskeletal: Positive for arthralgias.  Neurological: Negative for headaches.      Allergies  Review of patient's allergies indicates no known allergies.  Home Medications   Current Outpatient Rx  Name  Route  Sig  Dispense  Refill  . neomycin-polymyxin-hydrocortisone (CORTISPORIN) 3.5-10000-1 otic suspension   Right Ear   Place 4 drops into the right ear 4 (four) times daily. X 7 days   10 mL   0   . olmesartan-hydrochlorothiazide (BENICAR HCT) 40-25 MG per tablet   Oral   Take 1 tablet by mouth daily.         . benzonatate (TESSALON) 100 MG capsule   Oral   Take 100 mg by mouth 3 (three) times daily as needed for cough.         . ciprofloxacin (CIPRO) 500 MG tablet   Oral   Take 1 tablet (500 mg total) by mouth 2 (two) times daily. One po bid x 7 days   14 tablet   0   . naproxen sodium (ANAPROX) 220 MG tablet   Oral   Take 440 mg by mouth 2 (two) times daily as needed (for pain).          Triage Vitals: BP 98/53  Pulse 100  Temp(Src) 99.6 F (37.6 C) (Oral)  Resp 24  SpO2 100%  Physical Exam  Nursing note and vitals reviewed. Constitutional: She appears well-developed. No distress.  HENT:  Head: Normocephalic and atraumatic.  Mouth/Throat: Oropharynx is clear and moist.  Left TM normal. Some fibrinous debris in the right ear consistent with  Eyes: Conjunctivae are normal.  Pinpoint pupils  Neck: Normal range of motion. Neck supple.  Cardiovascular: Normal rate, regular rhythm and intact distal pulses.   Pulmonary/Chest: Effort normal and breath sounds normal. No stridor. She has no wheezes. She has no rhonchi. She has no rales.  Abdominal: Soft. Bowel sounds are normal. There is no tenderness. There is no rebound and no guarding.  Musculoskeletal: Normal range of motion.  No step  offs no point tenderness below hips. All midline  Lymphadenopathy:    She has no cervical adenopathy.  Neurological: She has normal reflexes.  Awake and alert and speaking post narcan  Skin: Skin is warm and dry.  Psychiatric: She has a normal mood and affect.    ED Course  Procedures (including critical care time) DIAGNOSTIC STUDIES: Oxygen Saturation is 100% on RA, normal by my interpretation.    COORDINATION OF CARE: 11:40 PM- Pt advised of plan for treatment and pt agrees.    Labs Review Labs Reviewed  CBC WITH DIFFERENTIAL  BASIC METABOLIC PANEL  URINALYSIS, ROUTINE W REFLEX MICROSCOPIC   Imaging Review No results found.   EKG Interpretation None      MDM   Final diagnoses:  None     Date: 05/03/2013  Rate: 88  Rhythm: normal sinus rhythm  QRS Axis: normal  Intervals: normal  ST/T Wave abnormalities: normal  Conduction Disutrbances: none  Narrative Interpretation: unremarkable   MDM Reviewed: previous chart, nursing note and vitals Reviewed previous: labs Interpretation: labs, ECG, x-ray and CT scan (elevated white count left shift, elevated lactate elevated troponin elevated ddimer) Consults: admitting MD   DDx: 1. Altered mental status likely secondary to narcotics as patient responds to narcan 2. Otitis externa extended into mastoid effusion will need unasyn IV and will need ENT consult and cipro ear drops 3. Elevated troponin with negative EKG, also elevated ddimer will need VQ in am will start hepain due to elevated renal function 4. Family unsure of code status at this time  Medications  Ampicillin-Sulbactam (UNASYN) 3 g in sodium chloride 0.9 % 100 mL IVPB (not administered)  naloxone (NARCAN) injection 0.4 mg (not administered)  sodium chloride 0.9 % bolus 500 mL (not administered)  naloxone (NARCAN) injection 0.4 mg (0.4 mg Nasal Given 05/02/13 2340)  sodium chloride 0.9 % bolus 500 mL (500 mLs Intravenous New Bag/Given 05/03/13 0125)   2nd bolus  CRITICAL CARE Performed by: Jai Bear K Kasiya Burck-Rasch Total critical care time: 90 minutes Critical care time was exclusive of separately billable procedures and treating other patients. Critical care was necessary to treat or prevent imminent or life-threatening deterioration. Critical care was time spent personally by me on the following activities: development of treatment plan with patient and/or surrogate as well as nursing, discussions with consultants, evaluation of patient's response to treatment, examination of patient, obtaining history from patient or surrogate, ordering and performing treatments and interventions, ordering and review of laboratory studies, ordering and review of radiographic studies, pulse oximetry and re-evaluation of patient's condition.    I personally performed the services described in this documentation, which was scribed in my presence. The recorded information has been reviewed and is  accurate.       Saban Heinlen Smitty Cords, MD 05/03/13 Earle Gell

## 2013-05-02 NOTE — ED Notes (Signed)
Family reports pt c/o hip pain x 3 days- also weak and not ambulating as usual- pt in wheelchair slumped over- raises head to verbal command and repositions self in chair

## 2013-05-03 ENCOUNTER — Other Ambulatory Visit: Payer: Self-pay

## 2013-05-03 ENCOUNTER — Encounter (HOSPITAL_BASED_OUTPATIENT_CLINIC_OR_DEPARTMENT_OTHER): Payer: Self-pay | Admitting: Emergency Medicine

## 2013-05-03 ENCOUNTER — Inpatient Hospital Stay (HOSPITAL_COMMUNITY): Payer: Medicare Other

## 2013-05-03 ENCOUNTER — Emergency Department (HOSPITAL_BASED_OUTPATIENT_CLINIC_OR_DEPARTMENT_OTHER): Payer: Medicare Other

## 2013-05-03 DIAGNOSIS — R799 Abnormal finding of blood chemistry, unspecified: Secondary | ICD-10-CM

## 2013-05-03 DIAGNOSIS — I10 Essential (primary) hypertension: Secondary | ICD-10-CM | POA: Diagnosis present

## 2013-05-03 DIAGNOSIS — H6690 Otitis media, unspecified, unspecified ear: Secondary | ICD-10-CM | POA: Diagnosis present

## 2013-05-03 DIAGNOSIS — R6510 Systemic inflammatory response syndrome (SIRS) of non-infectious origin without acute organ dysfunction: Secondary | ICD-10-CM

## 2013-05-03 DIAGNOSIS — D649 Anemia, unspecified: Secondary | ICD-10-CM | POA: Diagnosis present

## 2013-05-03 DIAGNOSIS — H60399 Other infective otitis externa, unspecified ear: Secondary | ICD-10-CM

## 2013-05-03 DIAGNOSIS — D72829 Elevated white blood cell count, unspecified: Secondary | ICD-10-CM | POA: Diagnosis present

## 2013-05-03 DIAGNOSIS — R4182 Altered mental status, unspecified: Secondary | ICD-10-CM | POA: Diagnosis present

## 2013-05-03 DIAGNOSIS — R7989 Other specified abnormal findings of blood chemistry: Secondary | ICD-10-CM | POA: Diagnosis present

## 2013-05-03 DIAGNOSIS — N179 Acute kidney failure, unspecified: Secondary | ICD-10-CM | POA: Diagnosis present

## 2013-05-03 DIAGNOSIS — A419 Sepsis, unspecified organism: Secondary | ICD-10-CM | POA: Diagnosis present

## 2013-05-03 DIAGNOSIS — R778 Other specified abnormalities of plasma proteins: Secondary | ICD-10-CM | POA: Diagnosis present

## 2013-05-03 DIAGNOSIS — I214 Non-ST elevation (NSTEMI) myocardial infarction: Secondary | ICD-10-CM

## 2013-05-03 DIAGNOSIS — R791 Abnormal coagulation profile: Secondary | ICD-10-CM

## 2013-05-03 DIAGNOSIS — E119 Type 2 diabetes mellitus without complications: Secondary | ICD-10-CM | POA: Diagnosis present

## 2013-05-03 LAB — GLUCOSE, CAPILLARY
GLUCOSE-CAPILLARY: 185 mg/dL — AB (ref 70–99)
Glucose-Capillary: 148 mg/dL — ABNORMAL HIGH (ref 70–99)
Glucose-Capillary: 150 mg/dL — ABNORMAL HIGH (ref 70–99)
Glucose-Capillary: 166 mg/dL — ABNORMAL HIGH (ref 70–99)
Glucose-Capillary: 116 mg/dL — ABNORMAL HIGH (ref 70–99)

## 2013-05-03 LAB — CBC WITH DIFFERENTIAL/PLATELET
BASOS ABS: 0 10*3/uL (ref 0.0–0.1)
Basophils Relative: 0 % (ref 0–1)
EOS ABS: 0 10*3/uL (ref 0.0–0.7)
Eosinophils Relative: 0 % (ref 0–5)
HCT: 27.4 % — ABNORMAL LOW (ref 36.0–46.0)
Hemoglobin: 9.3 g/dL — ABNORMAL LOW (ref 12.0–15.0)
LYMPHS PCT: 4 % — AB (ref 12–46)
Lymphs Abs: 1.1 10*3/uL (ref 0.7–4.0)
MCH: 32.5 pg (ref 26.0–34.0)
MCHC: 33.9 g/dL (ref 30.0–36.0)
MCV: 95.8 fL (ref 78.0–100.0)
Monocytes Absolute: 1.9 10*3/uL — ABNORMAL HIGH (ref 0.1–1.0)
Monocytes Relative: 7 % (ref 3–12)
NEUTROS ABS: 23.9 10*3/uL — AB (ref 1.7–7.7)
Neutrophils Relative %: 89 % — ABNORMAL HIGH (ref 43–77)
PLATELETS: 550 10*3/uL — AB (ref 150–400)
RBC: 2.86 MIL/uL — ABNORMAL LOW (ref 3.87–5.11)
RDW: 13.2 % (ref 11.5–15.5)
WBC: 26.9 10*3/uL — ABNORMAL HIGH (ref 4.0–10.5)

## 2013-05-03 LAB — BASIC METABOLIC PANEL
BUN: 38 mg/dL — ABNORMAL HIGH (ref 6–23)
CO2: 26 meq/L (ref 19–32)
Calcium: 10 mg/dL (ref 8.4–10.5)
Chloride: 90 mEq/L — ABNORMAL LOW (ref 96–112)
Creatinine, Ser: 1.7 mg/dL — ABNORMAL HIGH (ref 0.50–1.10)
GFR calc Af Amer: 30 mL/min — ABNORMAL LOW (ref >90)
GFR calc non Af Amer: 26 mL/min — ABNORMAL LOW (ref >90)
Glucose, Bld: 301 mg/dL — ABNORMAL HIGH (ref 70–99)
Potassium: 5.1 mEq/L (ref 3.7–5.3)
Sodium: 132 mEq/L — ABNORMAL LOW (ref 137–147)

## 2013-05-03 LAB — TROPONIN I
TROPONIN I: 0.67 ng/mL — AB (ref <0.30)
Troponin I: 1.26 ng/mL (ref <0.30)
Troponin I: 0.44 ng/mL (ref <0.30)

## 2013-05-03 LAB — URINALYSIS, ROUTINE W REFLEX MICROSCOPIC
GLUCOSE, UA: NEGATIVE mg/dL
HGB URINE DIPSTICK: NEGATIVE
Ketones, ur: 15 mg/dL — AB
Leukocytes, UA: NEGATIVE
Nitrite: NEGATIVE
PROTEIN: NEGATIVE mg/dL
Specific Gravity, Urine: 1.018 (ref 1.005–1.030)
Urobilinogen, UA: 0.2 mg/dL (ref 0.0–1.0)
pH: 5.5 (ref 5.0–8.0)

## 2013-05-03 LAB — RETICULOCYTES
RBC.: 3.16 MIL/uL — ABNORMAL LOW (ref 3.87–5.11)
RETIC COUNT ABSOLUTE: 66.4 10*3/uL (ref 19.0–186.0)
RETIC CT PCT: 2.1 % (ref 0.4–3.1)

## 2013-05-03 LAB — CBG MONITORING, ED: Glucose-Capillary: 293 mg/dL — ABNORMAL HIGH (ref 70–99)

## 2013-05-03 LAB — I-STAT CG4 LACTIC ACID, ED: Lactic Acid, Venous: 2.59 mmol/L — ABNORMAL HIGH (ref 0.5–2.2)

## 2013-05-03 LAB — MRSA PCR SCREENING: MRSA BY PCR: NEGATIVE

## 2013-05-03 LAB — D-DIMER, QUANTITATIVE: D-Dimer, Quant: 0.7 ug/mL-FEU — ABNORMAL HIGH (ref 0.00–0.48)

## 2013-05-03 MED ORDER — ONDANSETRON HCL 4 MG PO TABS: 4.0000 mg | ORAL_TABLET | Freq: Four times a day (QID) | ORAL | Status: DC | PRN

## 2013-05-03 MED ORDER — ALUM & MAG HYDROXIDE-SIMETH 200-200-20 MG/5ML PO SUSP
30.0000 mL | Freq: Four times a day (QID) | ORAL | Status: DC | PRN
  Filled 2013-05-03: qty 30

## 2013-05-03 MED ORDER — IRBESARTAN 300 MG PO TABS
300.0000 mg | ORAL_TABLET | Freq: Every day | ORAL | Status: DC
  Administered 2013-05-03 – 2013-05-07 (×5): 300 mg via ORAL
  Filled 2013-05-03 (×6): qty 1

## 2013-05-03 MED ORDER — INSULIN ASPART 100 UNIT/ML ~~LOC~~ SOLN
0.0000 [IU] | SUBCUTANEOUS | Status: DC
  Administered 2013-05-03: 1 [IU] via SUBCUTANEOUS
  Administered 2013-05-03: 2 [IU] via SUBCUTANEOUS
  Administered 2013-05-03: 1 [IU] via SUBCUTANEOUS
  Administered 2013-05-04: 3 [IU] via SUBCUTANEOUS
  Administered 2013-05-04: 1 [IU] via SUBCUTANEOUS

## 2013-05-03 MED ORDER — ASPIRIN 81 MG PO CHEW
81.0000 mg | CHEWABLE_TABLET | Freq: Every day | ORAL | Status: DC
  Administered 2013-05-03 – 2013-05-07 (×5): 81 mg via ORAL
  Filled 2013-05-03 (×5): qty 1

## 2013-05-03 MED ORDER — ACETAMINOPHEN 325 MG PO TABS: 650.0000 mg | ORAL_TABLET | Freq: Four times a day (QID) | ORAL | Status: DC | PRN

## 2013-05-03 MED ORDER — OLMESARTAN MEDOXOMIL-HCTZ 40-25 MG PO TABS: 1.0000 | ORAL_TABLET | Freq: Every day | ORAL | Status: DC

## 2013-05-03 MED ORDER — ATORVASTATIN CALCIUM 20 MG PO TABS
20.0000 mg | ORAL_TABLET | Freq: Every day | ORAL | Status: DC
  Administered 2013-05-03 – 2013-05-06 (×4): 20 mg via ORAL
  Filled 2013-05-03 (×5): qty 1

## 2013-05-03 MED ORDER — SODIUM CHLORIDE 0.9 % IV SOLN
3.0000 g | Freq: Once | INTRAVENOUS | Status: AC
  Administered 2013-05-03: 3 g via INTRAVENOUS
  Filled 2013-05-03: qty 3

## 2013-05-03 MED ORDER — VANCOMYCIN HCL 500 MG IV SOLR
500.0000 mg | INTRAVENOUS | Status: DC
  Filled 2013-05-03: qty 500

## 2013-05-03 MED ORDER — NALOXONE HCL 0.4 MG/ML IJ SOLN: 0.4000 mg | Freq: Once | INTRAMUSCULAR | Status: DC

## 2013-05-03 MED ORDER — ONDANSETRON HCL 4 MG/2ML IJ SOLN: 4.0000 mg | Freq: Four times a day (QID) | INTRAMUSCULAR | Status: DC | PRN

## 2013-05-03 MED ORDER — HYDROCHLOROTHIAZIDE 25 MG PO TABS
25.0000 mg | ORAL_TABLET | Freq: Every day | ORAL | Status: DC
Start: 2013-05-03 — End: 2013-05-03
  Administered 2013-05-03: 25 mg via ORAL
  Filled 2013-05-03: qty 1

## 2013-05-03 MED ORDER — SODIUM CHLORIDE 0.9 % IV BOLUS (SEPSIS)
500.0000 mL | Freq: Once | INTRAVENOUS | Status: AC
  Administered 2013-05-03: 500 mL via INTRAVENOUS

## 2013-05-03 MED ORDER — METOPROLOL TARTRATE 12.5 MG HALF TABLET
12.5000 mg | ORAL_TABLET | Freq: Two times a day (BID) | ORAL | Status: DC
  Administered 2013-05-03 – 2013-05-04 (×3): 12.5 mg via ORAL
  Filled 2013-05-03 (×5): qty 1

## 2013-05-03 MED ORDER — VANCOMYCIN HCL 500 MG IV SOLR
500.0000 mg | Freq: Once | INTRAVENOUS | Status: AC
  Administered 2013-05-03: 500 mg via INTRAVENOUS
  Filled 2013-05-03: qty 500

## 2013-05-03 MED ORDER — OXYCODONE HCL 5 MG PO TABS
5.0000 mg | ORAL_TABLET | ORAL | Status: DC | PRN
  Administered 2013-05-07: 5 mg via ORAL
  Filled 2013-05-03: qty 1

## 2013-05-03 MED ORDER — ENOXAPARIN SODIUM 30 MG/0.3ML ~~LOC~~ SOLN
30.0000 mg | SUBCUTANEOUS | Status: DC
  Administered 2013-05-03 – 2013-05-07 (×5): 30 mg via SUBCUTANEOUS
  Filled 2013-05-03 (×5): qty 0.3

## 2013-05-03 MED ORDER — SODIUM CHLORIDE 0.9 % IV SOLN
INTRAVENOUS | Status: DC
Start: 2013-05-03 — End: 2013-05-05
  Administered 2013-05-03: 18:00:00 via INTRAVENOUS
  Administered 2013-05-04: 1000 mL via INTRAVENOUS

## 2013-05-03 MED ORDER — DEXTROSE 5 % IV SOLN
1.0000 g | INTRAVENOUS | Status: DC
  Administered 2013-05-03 – 2013-05-04 (×2): 1 g via INTRAVENOUS
  Filled 2013-05-03 (×4): qty 1

## 2013-05-03 MED ORDER — ACETAMINOPHEN 650 MG RE SUPP: 650.0000 mg | Freq: Four times a day (QID) | RECTAL | Status: DC | PRN

## 2013-05-03 MED ORDER — HYDROMORPHONE HCL PF 1 MG/ML IJ SOLN: 0.5000 mg | INTRAMUSCULAR | Status: DC | PRN

## 2013-05-03 NOTE — Progress Notes (Signed)
UR Completed.  Taraya Steward Jane Stark [place] [phone] 05/03/2013  

## 2013-05-03 NOTE — ED Notes (Signed)
Pt taken to Radiology and will take EKG when Pt returns

## 2013-05-03 NOTE — ED Notes (Signed)
Patient currently in radiology.

## 2013-05-03 NOTE — Progress Notes (Signed)
[facility] PROGRESS NOTE  Kathleen Weeks ZOX:[id number] DOB: [birth year] DOA: 05/02/2013 PCP: Gwynneth Aliment, MD  Assessment/Plan: 1. Sepsis, present on admission -Evidence by a white count of 26,900, lactate of 2.59, acute kidney injury, encephalopathy -Chest x-ray showing no acute cardiopulmonary disease, urinalysis unremarkable -Possible source of infection otitis media -We'll continue broad-spectrum IV antimicrobial therapy with vancomycin and cefepime -Awaiting blood cultures -Provide supportive care, IV fluids 2.  Elevated troponin. -Patient initially presented with a troponin 1.26, coming down to 0.67 -Patient having elevated creatinine 1.7 likely leading to decreased clearance -I. suspect secondary to demand ischemia in setting of sepsis -Will check a transthoracic echocardiogram -Cardiology consulted 3.  Acute kidney injury -Labs showed a creatinine 1.7, increase from 1.16 on 04/07/2013 -Likely secondary to sepsis/hypovolemia -Continue IV fluids 4.  Acute encephalopathy -By secondary to sepsis -Family members reporting a steep functional decline over the past 2-3 days 5. Elevated d-dimer -Patient having d-dimer 0.7 -Nonspecific, could be related to sepsis -Patient have elevated troponins, attempted to perform VQ scan which was unsuccessful given lack of patient cooperation -Patient having O2 sats in the upper 90s on room air 6. Type 2 diabetes mellitus -Accu-Cheks every 4 hours with status he'll coverage  Code Status: Full code  Family Communication: Spoke to family members in the waiting room Disposition Plan: Continue close monitoring in the step down unit   Procedures:  Pending transthoracic echocardiogram  Antibiotics:  Vancomycin IV  Cefepime IV  HPI/Subjective: Patient is a pleasant 80 year old female with a past medical history of hypertension and type 2 diabetes mellitus and is admitted overnight, presented with functional decline, failure  to thrive, having progressive weakness and minimal by mouth intake. She has had complaints of left hip pain although family members had not reported recent falls. Approximately one week ago she was noted have yellowish drainage from the right ear was prescribed ciprofloxacin and neomycin.  Objective: Filed Vitals:   05/03/13 1300  BP: 147/62  Pulse: 77  Temp:   Resp:     Intake/Output Summary (Last 24 hours) at 05/03/13 1516 Last data filed at 05/03/13 0926  Gross per 24 hour  Intake    350 ml  Output      0 ml  Net    350 ml   Filed Weights   05/03/13 0459  Weight: 48.9 kg (107 lb 12.9 oz)    Exam:   General:  Ill-appearing, in no acute distress, following simple commands for me  Cardiovascular: Regular rate rhythm normal S1-S2  Respiratory: Clear to auscultation bilaterally  Abdomen: Soft nontender nondistended  Musculoskeletal: No edema, muscle atrophy present   Data Reviewed: Basic Metabolic Panel:  Recent Labs Lab 05/02/13 2350  NA 132*  K 5.1  CL 90*  CO2 26  GLUCOSE 301*  BUN 38*  CREATININE 1.70*  CALCIUM 10.0   Liver Function Tests: No results found for this basename: AST, ALT, ALKPHOS, BILITOT, PROT, ALBUMIN,  in the last 168 hours No results found for this basename: LIPASE, AMYLASE,  in the last 168 hours No results found for this basename: AMMONIA,  in the last 168 hours CBC:  Recent Labs Lab 05/02/13 2350  WBC 26.9*  NEUTROABS 23.9*  HGB 9.3*  HCT 27.4*  MCV 95.8  PLT 550*   Cardiac Enzymes:  Recent Labs Lab 05/03/13 0034 05/03/13 1320  TROPONINI 1.26* 0.67*   BNP (last 3 results)  Recent Labs  11/07/12 1523  PROBNP 395.9   CBG:  Recent Labs Lab 05/03/13  0106 05/03/13 0434 05/03/13 0802 05/03/13 1146  GLUCAP 293* 185* 148* 150*    Recent Results (from the past 240 hour(s))  MRSA PCR SCREENING     Status: None   Collection Time    05/03/13  4:06 AM      Result Value Ref Range Status   MRSA by PCR NEGATIVE   NEGATIVE Final   Comment:            The GeneXpert MRSA Assay (FDA     approved for NASAL specimens     only), is one component of a     comprehensive MRSA colonization     surveillance program. It is not     intended to diagnose MRSA     infection nor to guide or     monitor treatment for     MRSA infections.     Studies: Dg Chest 2 View  05/03/2013   CLINICAL DATA:  Hypertension and diabetes  EXAM: CHEST  2 VIEW  COMPARISON:  04/07/2013  FINDINGS: The heart size and mediastinal contours are within normal limits. No airspace consolidation identified. No pleural effusion or edema. Scoliosis deformity involves the thoracic spine. There is multi level degenerative disc disease noted. The visualized skeletal structures are unremarkable.  IMPRESSION: No active cardiopulmonary disease.   Electronically Signed   By: Signa Kellaylor  Stroud M.D.   On: 05/03/2013 00:39   Dg Pelvis 1-2 Views  05/03/2013   CLINICAL DATA:  Hip pain for 3 days  EXAM: PELVIS - 1-2 VIEW  COMPARISON:  None  FINDINGS: Bones are osteopenic diminishing sensitivity for nondisplaced fracture. Severe osteoarthritis with mild protrusio deformity involves the right hip. There is moderate osteoarthritis involving the left hip. No acute fracture or subluxation identified. The patient is status post previous posterior decompression and hardware fusion of the lumbar spine.  IMPRESSION: 1. No acute findings. 2. Moderate to severe osteoarthritis, right greater than left.   Electronically Signed   By: Signa Kellaylor  Stroud M.D.   On: 05/03/2013 00:43   Ct Head Wo Contrast  05/03/2013   CLINICAL DATA:  Left hip pain. Decreased responsiveness and increased fatigue.  EXAM: CT HEAD WITHOUT CONTRAST  TECHNIQUE: Contiguous axial images were obtained from the base of the skull through the vertex without intravenous contrast.  COMPARISON:  11/07/2012  FINDINGS: Diffuse cerebral atrophy. Ventricular dilatation consistent with central atrophy. Low-attenuation change  in the right posterior parietal and occipital region consistent with old infarct, present on the previous study. No mass effect or midline shift. No abnormal extra-axial fluid collections. No acute intracranial hemorrhage. There is mucosal thickening throughout the paranasal sinuses with opacification of the right mastoid air cells. Dense vascular calcifications.  IMPRESSION: No acute intracranial abnormalities. Chronic atrophy. 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 Mucosal thickening throughout the paranasal sinuses with opacification of the right mastoid air cells.   Electronically Signed   By: Burman NievesWilliam  Stevens M.D.   On: 05/03/2013 00:45    Scheduled Meds: . ceFEPime (MAXIPIME) IV  1 g Intravenous Q24H  . enoxaparin (LOVENOX) injection  30 mg Subcutaneous Q24H  . hydrochlorothiazide  25 mg Oral Daily  . insulin aspart  0-9 Units Subcutaneous 6 times per day  . irbesartan  300 mg Oral Daily  . [START ON 05/05/2013] vancomycin  500 mg Intravenous Q48H   Continuous Infusions: . sodium chloride 75 mL/hr at 05/03/13 0930    Principal Problem:   Sepsis Active Problems:   Weakness   SIRS (systemic inflammatory response  syndrome)   Leukocytosis   Altered mental status   Acute renal failure   Elevated troponin   Elevated d-dimer   Otitis media   Essential hypertension, benign   Type II or unspecified type diabetes mellitus without mention of complication, not stated as uncontrolled   Anemia    Time spent: 35 min    Jeralyn Bennett  [facility] Pager [phone]. If 7PM-7AM, please contact night-coverage at [email or web address], password [facility] 05/03/2013, 3:16 PM  LOS: 1 day

## 2013-05-03 NOTE — Progress Notes (Signed)
ANTIBIOTIC CONSULT NOTE - INITIAL  Pharmacy Consult for vancomycin and cefepime Indication: rule out sepsis  No Known Allergies  Patient Measurements: Height: 5' (152.4 cm) Weight: 107 lb 12.9 oz (48.9 kg) IBW/kg (Calculated) : 45.5  Vital Signs: Temp: 99.2 F (37.3 C) (04/12 0459) Temp src: Oral (04/12 0459) BP: 140/98 mmHg (04/12 0500) Pulse Rate: 83 (04/12 0500)  Labs:  Recent Labs  05/02/13 2350  WBC 26.9*  HGB 9.3*  PLT 550*  CREATININE 1.70*   Estimated Creatinine Clearance: 16.4 ml/min (by C-G formula based on Cr of 1.7).   Microbiology: Recent Results (from the past 720 hour(s))  MRSA PCR SCREENING     Status: None   Collection Time    05/03/13  4:06 AM      Result Value Ref Range Status   MRSA by PCR NEGATIVE  NEGATIVE Final   Comment:            The GeneXpert MRSA Assay (FDA     approved for NASAL specimens     only), is one component of a     comprehensive MRSA colonization     surveillance program. It is not     intended to diagnose MRSA     infection nor to guide or     monitor treatment for     MRSA infections.    Medical History: Past Medical History  Diagnosis Date  . Hypertension   . Diabetes mellitus   . Arthritis     Medications:  Prescriptions prior to admission  Medication Sig Dispense Refill  . neomycin-polymyxin-hydrocortisone (CORTISPORIN) 3.5-10000-1 otic suspension Place 4 drops into the right ear 4 (four) times daily. X 7 days  10 mL  0  . olmesartan-hydrochlorothiazide (BENICAR HCT) 40-25 MG per tablet Take 1 tablet by mouth daily.      . benzonatate (TESSALON) 100 MG capsule Take 100 mg by mouth 3 (three) times daily as needed for cough.      . ciprofloxacin (CIPRO) 500 MG tablet Take 1 tablet (500 mg total) by mouth 2 (two) times daily. One po bid x 7 days  14 tablet  0  . naproxen sodium (ANAPROX) 220 MG tablet Take 440 mg by mouth 2 (two) times daily as needed (for pain).       Scheduled:  . enoxaparin (LOVENOX)  injection  30 mg Subcutaneous Q24H  . hydrochlorothiazide  25 mg Oral Daily  . irbesartan  300 mg Oral Daily   Infusions:  . sodium chloride      Assessment: 80yo female c/o weakness/fatigue and difficulty ambulating, found to be in ARF, AMS thought to be d/t narcotics, concern for sepsis, transferred to ICU, to start ABX.  Goal of Therapy:  Vancomycin trough level 15-20 mcg/ml  Plan:  [facility]; will begin vancomycin 500mg  IV Q48H and cefepime 1g IV Q24H and monitor CBC, Cx, levels prn.  Vernard GamblesVeronda Severiano Utsey, PharmD, BCPS  05/03/2013,6:20 AM

## 2013-05-03 NOTE — Consult Note (Addendum)
HPI: 80 year old female with no prior cardiac history for evaluation of non-ST elevation myocardial infarction. Patient cannot converse as she is severely hearing impaired. History obtained with the assistance of her children. The patient has limited activities at home because of weakness. Her symptoms have worsened over the past 2 weeks. She has generalized weakness and decreased appetite. She had fever and drainage from her right ear. She has been admitted and diagnosed with sepsis/mastoiditis. She has been started on antibiotics. Cardiac enzymes abnormal and cardiology asked to evaluate. There apparently is no dyspnea or chest pain. She has had a nonproductive cough and also complains of right hip pain.  Medications Prior to Admission  Medication Sig Dispense Refill  . benzonatate (TESSALON) 100 MG capsule Take 100 mg by mouth 3 (three) times daily as needed for cough.      [name] glipiZIDE (GLUCOTROL) 5 MG tablet Take 10 mg by mouth daily before breakfast.      . Hydrocodone-Acetaminophen 5-300 MG TABS Take 1-2 tablets by mouth every 6 (six) hours as needed (pain).       . naproxen sodium (ANAPROX) 220 MG tablet Take 220 mg by mouth 2 (two) times daily with a meal.      . neomycin-polymyxin-hydrocortisone (CORTISPORIN) 3.5-10000-1 otic suspension Place 4 drops into the right ear 4 (four) times daily. X 7 days  10 mL  0  . olmesartan-hydrochlorothiazide (BENICAR HCT) 40-25 MG per tablet Take 1 tablet by mouth daily.      . ciprofloxacin (CIPRO) 500 MG tablet Take 1 tablet (500 mg total) by mouth 2 (two) times daily. One po bid x 7 days  14 tablet  0    No Known Allergies  Past Medical History  Diagnosis Date  . Hypertension   . Diabetes mellitus   . Arthritis   . Renal insufficiency     Past Surgical History  Procedure Laterality Date  . Back surgery      History   Social History  . Marital Status: Widowed    Spouse Name: N/A    Number of Children: 7  . Years of Education: 12+    Occupational History  . Not on file.   Social History Main Topics  . Smoking status: Former Research scientist (life sciences)  . Smokeless tobacco: Never Used  . Alcohol Use: No  . Drug Use: No  . Sexual Activity: Not on file   Other Topics Concern  . Not on file   Social History Narrative   Patient lives at home with her daughter.    Patient is retired.    Patient has 5 children.    Patient has a high school education.     History reviewed. No pertinent family history.  ROS:  Right hip pain, generalized weakness, nonproductive cough, fever but no hemoptysis, dysphasia, odynophagia, melena, hematochezia, dysuria, hematuria, rash, seizure activity, orthopnea, PND, pedal edema, claudication. Remaining systems are negative.  Physical Exam:   Blood pressure 147/62, pulse 77, temperature 99.1 F (37.3 C), temperature source Oral, resp. rate 19, height 5' (1.524 m), weight 107 lb 12.9 oz (48.9 kg), SpO2 99.00%.  General:  Well developed/frail in NAD Skin warm/dry Patient not depressed No peripheral clubbing Back-normal HEENT-normal/normal eyelids Neck supple/normal carotid upstroke bilaterally; no bruits; no JVD; no thyromegaly chest - CTA/ normal expansion CV - RRR/normal S1 and S2; no murmurs, rubs or gallops;  PMI nondisplaced Abdomen -NT/ND, no HSM, no mass, + bowel sounds, no bruit 2+ femoral pulses, no bruits Ext-no edema,  No chords, distal pulses diminished Neuro-grossly nonfocal; Severely hearing impaired  ECG Sinus rhythm with no ST changes.  Results for orders placed during the hospital encounter of 05/02/13 (from the past 48 hour(s))  CBC WITH DIFFERENTIAL     Status: Abnormal   Collection Time    05/02/13 11:50 PM      Result Value Ref Range   WBC 26.9 (*) 4.0 - 10.5 K/uL   RBC 2.86 (*) 3.87 - 5.11 MIL/uL   Hemoglobin 9.3 (*) 12.0 - 15.0 g/dL   HCT 27.4 (*) 36.0 - 46.0 %   MCV 95.8  78.0 - 100.0 fL   MCH 32.5  26.0 - 34.0 pg   MCHC 33.9  30.0 - 36.0 g/dL   RDW 13.2  11.5 -  15.5 %   Platelets 550 (*) 150 - 400 K/uL   Neutrophils Relative % 89 (*) 43 - 77 %   Lymphocytes Relative 4 (*) 12 - 46 %   Monocytes Relative 7  3 - 12 %   Eosinophils Relative 0  0 - 5 %   Basophils Relative 0  0 - 1 %   Neutro Abs 23.9 (*) 1.7 - 7.7 K/uL   Lymphs Abs 1.1  0.7 - 4.0 K/uL   Monocytes Absolute 1.9 (*) 0.1 - 1.0 K/uL   Eosinophils Absolute 0.0  0.0 - 0.7 K/uL   Basophils Absolute 0.0  0.0 - 0.1 K/uL   RBC Morphology STOMATOCYTES     Comment: ROULEAUX   WBC Morphology WHITE COUNT CONFIRMED ON SMEAR     Comment: VACUOLATED NEUTROPHILS   Smear Review PLATELET COUNT CONFIRMED BY SMEAR    BASIC METABOLIC PANEL     Status: Abnormal   Collection Time    05/02/13 11:50 PM      Result Value Ref Range   Sodium 132 (*) 137 - 147 mEq/L   Potassium 5.1  3.7 - 5.3 mEq/L   Chloride 90 (*) 96 - 112 mEq/L   CO2 26  19 - 32 mEq/L   Glucose, Bld 301 (*) 70 - 99 mg/dL   BUN 38 (*) 6 - 23 mg/dL   Creatinine, Ser 1.70 (*) 0.50 - 1.10 mg/dL   Calcium 10.0  8.4 - 10.5 mg/dL   GFR calc non Af Amer 26 (*) >90 mL/min   GFR calc Af Amer 30 (*) >90 mL/min   Comment: (NOTE)     The eGFR has been calculated using the CKD EPI equation.     This calculation has not been validated in all clinical situations.     eGFR's persistently <90 mL/min signify possible Chronic Kidney     Disease.  TROPONIN I     Status: Abnormal   Collection Time    05/03/13 12:34 AM      Result Value Ref Range   Troponin I 1.26 (*) <0.30 ng/mL   Comment:            Due to the release kinetics of cTnI,     a negative result within the first hours     of the onset of symptoms does not rule out     myocardial infarction with certainty.     If myocardial infarction is still suspected,     repeat the test at appropriate intervals.     CRITICAL RESULT CALLED TO, READ BACK BY AND VERIFIED WITH:     BENTON,J,RN @ 0107 05/03/13 BY GWYN,P  URINALYSIS, ROUTINE W REFLEX MICROSCOPIC  Status: Abnormal   Collection Time     05/03/13 12:55 AM      Result Value Ref Range   Color, Urine AMBER (*) YELLOW   Comment: BIOCHEMICALS MAY BE AFFECTED BY COLOR   APPearance CLEAR  CLEAR   Specific Gravity, Urine 1.018  1.005 - 1.030   pH 5.5  5.0 - 8.0   Glucose, UA NEGATIVE  NEGATIVE mg/dL   Hgb urine dipstick NEGATIVE  NEGATIVE   Bilirubin Urine SMALL (*) NEGATIVE   Ketones, ur 15 (*) NEGATIVE mg/dL   Protein, ur NEGATIVE  NEGATIVE mg/dL   Urobilinogen, UA 0.2  0.0 - 1.0 mg/dL   Nitrite NEGATIVE  NEGATIVE   Leukocytes, UA NEGATIVE  NEGATIVE   Comment: MICROSCOPIC NOT DONE ON URINES WITH NEGATIVE PROTEIN, BLOOD, LEUKOCYTES, NITRITE, OR GLUCOSE <1000 mg/dL.  CBG MONITORING, ED     Status: Abnormal   Collection Time    05/03/13  1:06 AM      Result Value Ref Range   Glucose-Capillary 293 (*) 70 - 99 mg/dL  I-STAT CG4 LACTIC ACID, ED     Status: Abnormal   Collection Time    05/03/13  1:07 AM      Result Value Ref Range   Lactic Acid, Venous 2.59 (*) 0.5 - 2.2 mmol/L  D-DIMER, QUANTITATIVE     Status: Abnormal   Collection Time    05/03/13  1:34 AM      Result Value Ref Range   D-Dimer, Quant 0.70 (*) 0.00 - 0.48 ug/mL-FEU   Comment:            AT THE INHOUSE ESTABLISHED CUTOFF     VALUE OF 0.48 ug/mL FEU,     THIS ASSAY HAS BEEN DOCUMENTED     IN THE LITERATURE TO HAVE     A SENSITIVITY AND NEGATIVE     PREDICTIVE VALUE OF AT LEAST     98 TO 99%.  THE TEST RESULT     SHOULD BE CORRELATED WITH     AN ASSESSMENT OF THE CLINICAL     PROBABILITY OF DVT / VTE.  MRSA PCR SCREENING     Status: None   Collection Time    05/03/13  4:06 AM      Result Value Ref Range   MRSA by PCR NEGATIVE  NEGATIVE   Comment:            The GeneXpert MRSA Assay (FDA     approved for NASAL specimens     only), is one component of a     comprehensive MRSA colonization     surveillance program. It is not     intended to diagnose MRSA     infection nor to guide or     monitor treatment for     MRSA infections.   GLUCOSE, CAPILLARY     Status: Abnormal   Collection Time    05/03/13  4:34 AM      Result Value Ref Range   Glucose-Capillary 185 (*) 70 - 99 mg/dL   Comment 1 Notify RN    GLUCOSE, CAPILLARY     Status: Abnormal   Collection Time    05/03/13  8:02 AM      Result Value Ref Range   Glucose-Capillary 148 (*) 70 - 99 mg/dL  GLUCOSE, CAPILLARY     Status: Abnormal   Collection Time    05/03/13 11:46 AM      Result Value Ref Range   Glucose-Capillary  150 (*) 70 - 99 mg/dL  TROPONIN I     Status: Abnormal   Collection Time    05/03/13  1:20 PM      Result Value Ref Range   Troponin I 0.67 (*) <0.30 ng/mL   Comment:            Due to the release kinetics of cTnI,     a negative result within the first hours     of the onset of symptoms does not rule out     myocardial infarction with certainty.     If myocardial infarction is still suspected,     repeat the test at appropriate intervals.     CRITICAL RESULT CALLED TO, READ BACK BY AND VERIFIED WITH:     C.BURKE,RN 1424 05/03/13 M.CAMPBELL  RETICULOCYTES     Status: Abnormal   Collection Time    05/03/13  1:20 PM      Result Value Ref Range   Retic Ct Pct 2.1  0.4 - 3.1 %   RBC. 3.16 (*) 3.87 - 5.11 MIL/uL   Retic Count, Manual 66.4  19.0 - 186.0 K/uL    Dg Chest 2 View  05/03/2013   CLINICAL DATA:  Hypertension and diabetes  EXAM: CHEST  2 VIEW  COMPARISON:  04/07/2013  FINDINGS: The heart size and mediastinal contours are within normal limits. No airspace consolidation identified. No pleural effusion or edema. Scoliosis deformity involves the thoracic spine. There is multi level degenerative disc disease noted. The visualized skeletal structures are unremarkable.  IMPRESSION: No active cardiopulmonary disease.   Electronically Signed   By: Kerby Moors M.D.   On: 05/03/2013 00:39   Dg Pelvis 1-2 Views  05/03/2013   CLINICAL DATA:  Hip pain for 3 days  EXAM: PELVIS - 1-2 VIEW  COMPARISON:  None  FINDINGS: Bones are osteopenic  diminishing sensitivity for nondisplaced fracture. Severe osteoarthritis with mild protrusio deformity involves the right hip. There is moderate osteoarthritis involving the left hip. No acute fracture or subluxation identified. The patient is status post previous posterior decompression and hardware fusion of the lumbar spine.  IMPRESSION: 1. No acute findings. 2. Moderate to severe osteoarthritis, right greater than left.   Electronically Signed   By: Kerby Moors M.D.   On: 05/03/2013 00:43   Ct Head Wo Contrast  05/03/2013   CLINICAL DATA:  Left hip pain. Decreased responsiveness and increased fatigue.  EXAM: CT HEAD WITHOUT CONTRAST  TECHNIQUE: Contiguous axial images were obtained from the base of the skull through the vertex without intravenous contrast.  COMPARISON:  11/07/2012  FINDINGS: Diffuse cerebral atrophy. Ventricular dilatation consistent with central atrophy. Low-attenuation change in the right posterior parietal and occipital region consistent with old infarct, present on the previous study. No mass effect or midline shift. No abnormal extra-axial fluid collections. No acute intracranial hemorrhage. There is mucosal thickening throughout the paranasal sinuses with opacification of the right mastoid air cells. Dense vascular calcifications.  IMPRESSION: No acute intracranial abnormalities. Chronic atrophy. 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 Mucosal thickening throughout the paranasal sinuses with opacification of the right mastoid air cells.   Electronically Signed   By: Lucienne Capers M.D.   On: 05/03/2013 00:45    Assessment/Plan 1 non-ST elevation myocardial infarction-the patient presented with generalized weakness, fever and sepsis. Cardiac enzymes were obtained for unclear reasons. The patient has not had chest pain or dyspnea. Electrocardiogram shows no ST changes. This most likely represents demand ischemia in the  setting of sepsis and anemia. Long discussion with  patient's family. Given her age and overall medical condition I would like to be conservative if possible. They are in agreement. Would treat for mastoiditis, sepsis and dehydration. If she makes significant improvement we could consider a nuclear study for risk stratification. Otherwise we'll plan medical therapy long time. Check echocardiogram for LV function.Treat with aspirin, Low-dose metoprolol and statin. 2 sepsis/mastoiditis-continue antibiotics. Management per primary care. 3 renal insufficiency-There is most likely a prerenal component. Agree with gentle hydration. 4 normocytic anemia-further evaluation per primary care. 5 right hip pain-management per primary care. 6 diabetes mellitus-continue present medications. 7 hypertension-continue present medications except discontinue HCTZ given dehydration.  Kirk Ruths MD 05/03/2013, 3:53 PM

## 2013-05-03 NOTE — H&P (Addendum)
[facility] History and Physical  Kathleen Weeks ZOX:[id number] DOB: 1922-03-30 DOA: 05/02/2013  Referring physician: EDP PCP: Gwynneth Aliment, MD  Specialists:   Chief Complaint:   Weakness and Increased Left Hip Pain  HPI: Kathleen Weeks is a 80 y.o. female with a history of HTN and DM2 who was taken to the [facility] ED due to progressive weakness and poor intake of foods and liquids over the past 3 days.  She also had been having increased complaints of left hip pain but has not had any falls according to her daughters who are at the bedside and give the history.  She had had continued yellowish drainage from her right ear despite antibiotic treatment with Cipro orally and Neomycin Otic drops that were completed 1 week ago.  She began to have fevers today.   At baseline, Kathleen Weeks is able to walk with assistance but has been in the bed for the past 3 days due to weakness.       At the [facility] ED, She was administered IV Narcan x 1 dose due to her hypersomnolence which was thought to be due to the Vicodin rx she was recently prescribed for her right hip pain.   She was also found to have multiple lab abnormalities including a Leukocytosis of 26.9K, and a Thrombocytosis ( 550K), and elevated lactic Acid level ( of 2.59).  She was also found to have an elevated BUN/Cr of 38/1.70, and an elevated troponin of 1.26 and an elevated D-dimer of 0.70.    A CT scan of the Head revealed findings consistent with Mastoiditis on the right .   She was placed on IV Unasyn and transferred to [facility] ICU for admission.      Review of Systems: Unable to Obtain from the Patient  Past Medical History  Diagnosis Date  . Hypertension   . Diabetes mellitus   . Arthritis       Past Surgical History  Procedure Laterality Date  . Back surgery         Prior to Admission medications   Medication Sig Start Date End Date Taking? Authorizing Provider  neomycin-polymyxin-hydrocortisone  (CORTISPORIN) 3.5-10000-1 otic suspension Place 4 drops into the right ear 4 (four) times daily. X 7 days 04/18/13  Yes Geoffery Lyons, MD  olmesartan-hydrochlorothiazide (BENICAR HCT) 40-25 MG per tablet Take 1 tablet by mouth daily.   Yes Historical Provider, MD  benzonatate (TESSALON) 100 MG capsule Take 100 mg by mouth 3 (three) times daily as needed for cough.    Historical Provider, MD  ciprofloxacin (CIPRO) 500 MG tablet Take 1 tablet (500 mg total) by mouth 2 (two) times daily. One po bid x 7 days 04/18/13   Geoffery Lyons, MD  naproxen sodium (ANAPROX) 220 MG tablet Take 440 mg by mouth 2 (two) times daily as needed (for pain).    Historical Provider, MD      No Known Allergies   Social History:  Lives with Daughter, Kathleen Weeks with Assistance   reports that she has quit smoking. She has never used smokeless tobacco. She reports that she does not drink alcohol or use illicit drugs.     History reviewed. No pertinent family history.     Physical Exam:  GEN: Somnolent but Arousible Elderly Cachectic appearing 80 y.o. African American female  examined  and in no acute distress; cooperative with exam Filed Vitals:   05/03/13 0400 05/03/13 0439 05/03/13 0459 05/03/13 0500  BP: 142/68   140/98  Pulse: 83  95 83  Temp:  99.2 F (37.3 C) 99.2 F (37.3 C)   TempSrc:  Oral Oral   Resp: 23   26  Height:   5' (1.524 m)   Weight:   48.9 kg (107 lb 12.9 oz)   SpO2: 100%  100% 100%   Blood pressure 140/98, pulse 83, temperature 99.2 F (37.3 C), temperature source Oral, resp. rate 26, height 5' (1.524 m), weight 48.9 kg (107 lb 12.9 oz), SpO2 100.00%. PSYCH: She is alert and oriented x1; does not appear anxious does not appear depressed; affect is normal HEENT: Normocephalic and Atraumatic, Mucous membranes pink; PERRLA; EOM intact; Fundi:  Benign;  No scleral icterus, +Purulent exudate draining from Canal of Right Ear,  Nares: Patent, Oropharynx: Clear, Edentulous;  Neck:  FROM, no cervical  lymphadenopathy nor thyromegaly or carotid bruit; no JVD; Breasts:: Not examined CHEST WALL: No tenderness CHEST: Normal respiration, clear to auscultation bilaterally HEART: Regular rate and rhythm; no murmurs rubs or gallops BACK: No kyphosis or scoliosis; no CVA tenderness ABDOMEN: Positive Bowel Sounds, Scaphoid, soft non-tender; no masses, no organomegaly. Rectal Exam: Not done EXTREMITIES: No cyanosis, clubbing or edema; no ulcerations. Genitalia: not examined PULSES: 2+ and symmetric SKIN: Normal hydration no rash or ulceration CNS:  Alert and Oriented x1, Somnolent,  Able to move all 4 Extremites.  HOH Vascular: pulses palpable throughout    Labs on Admission:  Basic Metabolic Panel:  Recent Labs Lab 05/02/13 2350  NA 132*  K 5.1  CL 90*  CO2 26  GLUCOSE 301*  BUN 38*  CREATININE 1.70*  CALCIUM 10.0   Liver Function Tests: No results found for this basename: AST, ALT, ALKPHOS, BILITOT, PROT, ALBUMIN,  in the last 168 hours No results found for this basename: LIPASE, AMYLASE,  in the last 168 hours No results found for this basename: AMMONIA,  in the last 168 hours CBC:  Recent Labs Lab 05/02/13 2350  WBC 26.9*  NEUTROABS 23.9*  HGB 9.3*  HCT 27.4*  MCV 95.8  PLT 550*   Cardiac Enzymes:  Recent Labs Lab 05/03/13 0034  TROPONINI 1.26*    BNP (last 3 results)  Recent Labs  11/07/12 1523  PROBNP 395.9   CBG:  Recent Labs Lab 05/03/13 0106 05/03/13 0434  GLUCAP 293* 185*    Radiological Exams on Admission: Dg Chest 2 View  05/03/2013   CLINICAL DATA:  Hypertension and diabetes  EXAM: CHEST  2 VIEW  COMPARISON:  04/07/2013  FINDINGS: The heart size and mediastinal contours are within normal limits. No airspace consolidation identified. No pleural effusion or edema. Scoliosis deformity involves the thoracic spine. There is multi level degenerative disc disease noted. The visualized skeletal structures are unremarkable.  IMPRESSION: No active  cardiopulmonary disease.   Electronically Signed   By: Signa Kellaylor  Stroud M.D.   On: 05/03/2013 00:39   Dg Pelvis 1-2 Views  05/03/2013   CLINICAL DATA:  Hip pain for 3 days  EXAM: PELVIS - 1-2 VIEW  COMPARISON:  None  FINDINGS: Bones are osteopenic diminishing sensitivity for nondisplaced fracture. Severe osteoarthritis with mild protrusio deformity involves the right hip. There is moderate osteoarthritis involving the left hip. No acute fracture or subluxation identified. The patient is status post previous posterior decompression and hardware fusion of the lumbar spine.  IMPRESSION: 1. No acute findings. 2. Moderate to severe osteoarthritis, right greater than left.   Electronically Signed   By: Signa Kellaylor  Stroud M.D.   On: 05/03/2013 00:43  Ct Head Wo Contrast  05/03/2013   CLINICAL DATA:  Left hip pain. Decreased responsiveness and increased fatigue.  EXAM: CT HEAD WITHOUT CONTRAST  TECHNIQUE: Contiguous axial images were obtained from the base of the skull through the vertex without intravenous contrast.  COMPARISON:  11/07/2012  FINDINGS: Diffuse cerebral atrophy. Ventricular dilatation consistent with central atrophy. Low-attenuation change in the right posterior parietal and occipital region consistent with old infarct, present on the previous study. No mass effect or midline shift. No abnormal extra-axial fluid collections. No acute intracranial hemorrhage. There is mucosal thickening throughout the paranasal sinuses with opacification of the right mastoid air cells. Dense vascular calcifications.  IMPRESSION: No acute intracranial abnormalities. Chronic atrophy. 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 Mucosal thickening throughout the paranasal sinuses with opacification of the right mastoid air cells.   Electronically Signed   By: Burman Nieves M.D.   On: 05/03/2013 00:45      EKG: Independently reviewed. Normal Sinus Rhythm No acute S-T changes, Rate =88    Assessment/Plan:   80  y.o. female with  Principal Problem:   Sepsis Active Problems:   SIRS (systemic inflammatory response syndrome)   Weakness   Leukocytosis   Altered mental status   Acute renal failure   Elevated troponin   Elevated d-dimer   Otitis media   Essential hypertension, benign   Type II or unspecified type diabetes mellitus without mention of complication, not stated as uncontrolled   Anemia   Right Hip Pain     1.  Sepsis-  Blood cultures down at ED, Expand Antibiotic coverage to IV Vancomycin and Cefepime.  Source appears to be Otitis Media and Mastoiditis,  Monitor WBC trend.    2.  Leukocytosis- due to #1.    3.  Weakness- due to #1.    4.  Altered Mental Status /Acute Encephalopathy-  Head CT-Negative Acute,  Most likely due to Metabolic Cause,  #1.    5.  ARF-  IVFs , and Monitor BUN/Cr.    6.  Elevated Troponin Level-  Repeat Troponin, and cycle troponins.    7.  Elevated D-Dimer- Non-Specific, but V/Q ordered to rule out PE.     8.  Otitis Media- Partially treated but did not resolve,  IV antibiotics to cover possible Staph and Pseudomonas since Diabetic.    9.  HTN- continue Benicar/HCTZ. Monitor BPs.    10.  DM2- Diet controlled, Check HBA1c, And SSI coverage PRN.     11.  Anemia- check Anemia Panel.  Normnocytic Indices.     12.  DVT prophylaxis with Lovenox.       13.  Right Hip pain- No Fracture on X-ray, but has moderate Arthritis changes.   Pain Control PRN.       Code Status:  FULL CODE      Family Communication:    Daughters at Bedside Disposition Plan:      Inpatient Stepdown Bed   Time spent:  60 Minutes  Kosha Jaquith Velora Heckler [facility] Pager [phone]  If 7PM-7AM, please contact night-coverage [email or web address] Password [facility] 05/03/2013, 5:59 AM

## 2013-05-04 DIAGNOSIS — N179 Acute kidney failure, unspecified: Secondary | ICD-10-CM

## 2013-05-04 DIAGNOSIS — F09 Unspecified mental disorder due to known physiological condition: Secondary | ICD-10-CM

## 2013-05-04 DIAGNOSIS — I369 Nonrheumatic tricuspid valve disorder, unspecified: Secondary | ICD-10-CM

## 2013-05-04 LAB — GLUCOSE, CAPILLARY
GLUCOSE-CAPILLARY: 120 mg/dL — AB (ref 70–99)
GLUCOSE-CAPILLARY: 104 mg/dL — AB (ref 70–99)
GLUCOSE-CAPILLARY: 218 mg/dL — AB (ref 70–99)
Glucose-Capillary: 118 mg/dL — ABNORMAL HIGH (ref 70–99)
Glucose-Capillary: 151 mg/dL — ABNORMAL HIGH (ref 70–99)

## 2013-05-04 LAB — CBC
HCT: 25.2 % — ABNORMAL LOW (ref 36.0–46.0)
Hemoglobin: 8.5 g/dL — ABNORMAL LOW (ref 12.0–15.0)
MCH: 31.6 pg (ref 26.0–34.0)
MCHC: 33.7 g/dL (ref 30.0–36.0)
MCV: 93.7 fL (ref 78.0–100.0)
Platelets: 458 10*3/uL — ABNORMAL HIGH (ref 150–400)
RBC: 2.69 MIL/uL — ABNORMAL LOW (ref 3.87–5.11)
RDW: 13.8 % (ref 11.5–15.5)
WBC: 14.6 10*3/uL — ABNORMAL HIGH (ref 4.0–10.5)

## 2013-05-04 LAB — LACTIC ACID, PLASMA: Lactic Acid, Venous: 0.8 mmol/L (ref 0.5–2.2)

## 2013-05-04 LAB — BASIC METABOLIC PANEL
BUN: 26 mg/dL — AB (ref 6–23)
CO2: 21 mEq/L (ref 19–32)
CREATININE: 1.07 mg/dL (ref 0.50–1.10)
Calcium: 9.1 mg/dL (ref 8.4–10.5)
Chloride: 98 mEq/L (ref 96–112)
GFR calc Af Amer: 52 mL/min — ABNORMAL LOW (ref >90)
GFR, EST NON AFRICAN AMERICAN: 45 mL/min — AB (ref >90)
Glucose, Bld: 124 mg/dL — ABNORMAL HIGH (ref 70–99)
POTASSIUM: 4.3 meq/L (ref 3.7–5.3)
Sodium: 134 mEq/L — ABNORMAL LOW (ref 137–147)

## 2013-05-04 LAB — VITAMIN B12: Vitamin B-12: 669 pg/mL (ref 211–911)

## 2013-05-04 LAB — IRON AND TIBC
IRON: 12 ug/dL — AB (ref 42–135)
Saturation Ratios: 7 % — ABNORMAL LOW (ref 20–55)
TIBC: 180 ug/dL — ABNORMAL LOW (ref 250–470)
UIBC: 168 ug/dL (ref 125–400)

## 2013-05-04 LAB — HEMOGLOBIN A1C
HEMOGLOBIN A1C: 7.1 % — AB (ref <5.7)
Mean Plasma Glucose: 157 mg/dL — ABNORMAL HIGH (ref <117)

## 2013-05-04 LAB — FERRITIN: Ferritin: 341 ng/mL — ABNORMAL HIGH (ref 10–291)

## 2013-05-04 MED ORDER — INSULIN ASPART 100 UNIT/ML ~~LOC~~ SOLN: 0.0000 [IU] | Freq: Three times a day (TID) | SUBCUTANEOUS | Status: DC

## 2013-05-04 MED ORDER — VANCOMYCIN HCL 500 MG IV SOLR
500.0000 mg | INTRAVENOUS | Status: DC
  Administered 2013-05-04: 500 mg via INTRAVENOUS
  Filled 2013-05-04: qty 500

## 2013-05-04 NOTE — Progress Notes (Signed)
[facility] PROGRESS NOTE  Kathleen HeaterMary W Weeks ZOX:[id number] DOB: 11/29/1922 DOA: 05/02/2013 PCP: Gwynneth AlimentSANDERS,ROBYN N, MD  Assessment/Plan: 1. Sepsis, present on admission -Evidence by a white count of 26,900, lactate of 2.59, acute kidney injury, encephalopathy -Chest x-ray showing no acute cardiopulmonary disease, urinalysis unremarkable -Possible source of infection otitis media/mastioditis  -Labs improving, lactate down to 0.8 from 2.59 -Will continue broad-spectrum IV antimicrobial therapy with vancomycin and cefepime -Blood cultures showing no growth -Provide supportive care, IV fluids 2.  NSTEMI. -Patient initially presented with a troponin 1.26, coming down to 0.67 -Patient having elevated creatinine 1.7 likely leading to decreased clearance -I. suspect secondary to demand ischemia in setting of sepsis -Will check a transthoracic echocardiogram -Cardiology consulted 3.  Acute kidney injury -Labs showing an improvement to kidney function as Creatinine trending down to 1.07 from 1.7 -Likely secondary to sepsis/hypovolemia -Continue IV fluids 4.  Acute encephalopathy -Likely secondary to sepsis -Family members reporting a steep functional decline over the past 2-3 days -Improving as she appears for alert today 5. Elevated d-dimer -Patient having d-dimer 0.7 -Nonspecific, could be related to sepsis -Patient have elevated troponins, attempted to perform VQ scan which was unsuccessful given lack of patient cooperation -Patient having O2 sats in the upper 90s on room air 6. Type 2 diabetes mellitus -Accu-Cheks every 4 hours with status he'll coverage  Code Status: Full code  Family Communication: Spoke to family members in the waiting room Disposition Plan: Clinically improved, will transfer to Med/Surg   Procedures:  Pending transthoracic echocardiogram  Antibiotics:  Vancomycin IV  Cefepime IV  HPI/Subjective: Patient is a pleasant 80 year old female with a  past medical history of hypertension and type 2 diabetes mellitus and is admitted overnight, presented with functional decline, failure to thrive, having progressive weakness and minimal by mouth intake. She has had complaints of left hip pain although family members had not reported recent falls. Approximately one week ago she was noted have yellowish drainage from the right ear was prescribed ciprofloxacin and neomycin.  Patient seems improved, she is following simple commands for me, awke and alert.   Objective: Filed Vitals:   05/04/13 1150  BP: 129/70  Pulse: 66  Temp: 97.5 F (36.4 C)  Resp: 26    Intake/Output Summary (Last 24 hours) at 05/04/13 1404 Last data filed at 05/04/13 1100  Gross per 24 hour  Intake 1962.5 ml  Output    605 ml  Net 1357.5 ml   Filed Weights   05/03/13 0459 05/04/13 0408  Weight: 48.9 kg (107 lb 12.9 oz) 52 kg (114 lb 10.2 oz)    Exam:   General:  Looks better today, awake and alert, no distress  Cardiovascular: Regular rate rhythm normal S1-S2  Respiratory: Clear to auscultation bilaterally  Abdomen: Soft nontender nondistended  Musculoskeletal: No edema, muscle atrophy present   Data Reviewed: Basic Metabolic Panel:  Recent Labs Lab 05/02/13 2350 05/04/13 0314  NA 132* 134*  K 5.1 4.3  CL 90* 98  CO2 26 21  GLUCOSE 301* 124*  BUN 38* 26*  CREATININE 1.70* 1.07  CALCIUM 10.0 9.1   Liver Function Tests: No results found for this basename: AST, ALT, ALKPHOS, BILITOT, PROT, ALBUMIN,  in the last 168 hours No results found for this basename: LIPASE, AMYLASE,  in the last 168 hours No results found for this basename: AMMONIA,  in the last 168 hours CBC:  Recent Labs Lab 05/02/13 2350 05/04/13 0314  WBC 26.9* 14.6*  NEUTROABS 23.9*  --  HGB 9.3* 8.5*  HCT 27.4* 25.2*  MCV 95.8 93.7  PLT 550* 458*   Cardiac Enzymes:  Recent Labs Lab 05/03/13 0034 05/03/13 1320 05/03/13 1930  TROPONINI 1.26* 0.67* 0.44*   BNP  (last 3 results)  Recent Labs  11/07/12 1523  PROBNP 395.9   CBG:  Recent Labs Lab 05/03/13 2020 05/04/13 0006 05/04/13 0407 05/04/13 0831 05/04/13 1214  GLUCAP 116* 151* 120* 104* 118*    Recent Results (from the past 240 hour(s))  CULTURE, BLOOD (ROUTINE X 2)     Status: None   Collection Time    05/03/13 12:40 AM      Result Value Ref Range Status   Specimen Description BLOOD LEFT ARM   Final   Special Requests BOTTLES DRAWN AEROBIC AND ANAEROBIC 5ML EACH   Final   Culture  Setup Time     Final   Value: 05/03/2013 01:30     Performed at [facility]   Culture     Final   Value:        BLOOD CULTURE RECEIVED NO GROWTH TO DATE CULTURE WILL BE HELD FOR 5 DAYS BEFORE ISSUING A FINAL NEGATIVE REPORT     Performed at [facility]   Report Status PENDING   Incomplete  CULTURE, BLOOD (ROUTINE X 2)     Status: None   Collection Time    05/03/13 12:45 AM      Result Value Ref Range Status   Specimen Description BLOOD RIGHT ARM   Final   Special Requests BOTTLES DRAWN AEROBIC AND ANAEROBIC 5ML EACH   Final   Culture  Setup Time     Final   Value: 05/03/2013 01:30     Performed at [facility]   Culture     Final   Value:        BLOOD CULTURE RECEIVED NO GROWTH TO DATE CULTURE WILL BE HELD FOR 5 DAYS BEFORE ISSUING A FINAL NEGATIVE REPORT     Performed at [facility]   Report Status PENDING   Incomplete  MRSA PCR SCREENING     Status: None   Collection Time    05/03/13  4:06 AM      Result Value Ref Range Status   MRSA by PCR NEGATIVE  NEGATIVE Final   Comment:            The GeneXpert MRSA Assay (FDA     approved for NASAL specimens     only), is one component of a     comprehensive MRSA colonization     surveillance program. It is not     intended to diagnose MRSA     infection nor to guide or     monitor treatment for     MRSA infections.     Studies: Dg Chest 2 View  05/03/2013   CLINICAL DATA:  Hypertension and diabetes   EXAM: CHEST  2 VIEW  COMPARISON:  04/07/2013  FINDINGS: The heart size and mediastinal contours are within normal limits. No airspace consolidation identified. No pleural effusion or edema. Scoliosis deformity involves the thoracic spine. There is multi level degenerative disc disease noted. The visualized skeletal structures are unremarkable.  IMPRESSION: No active cardiopulmonary disease.   Electronically Signed   By: Signa Kellaylor  Stroud M.D.   On: 05/03/2013 00:39   Dg Pelvis 1-2 Views  05/03/2013   CLINICAL DATA:  Hip pain for 3 days  EXAM: PELVIS - 1-2 VIEW  COMPARISON:  None  FINDINGS: Bones are osteopenic diminishing sensitivity for nondisplaced fracture. Severe osteoarthritis with mild protrusio deformity involves the right hip. There is moderate osteoarthritis involving the left hip. No acute fracture or subluxation identified. The patient is status post previous posterior decompression and hardware fusion of the lumbar spine.  IMPRESSION: 1. No acute findings. 2. Moderate to severe osteoarthritis, right greater than left.   Electronically Signed   By: Signa Kell M.D.   On: 05/03/2013 00:43   Ct Head Wo Contrast  05/03/2013   CLINICAL DATA:  Left hip pain. Decreased responsiveness and increased fatigue.  EXAM: CT HEAD WITHOUT CONTRAST  TECHNIQUE: Contiguous axial images were obtained from the base of the skull through the vertex without intravenous contrast.  COMPARISON:  11/07/2012  FINDINGS: Diffuse cerebral atrophy. Ventricular dilatation consistent with central atrophy. Low-attenuation change in the right posterior parietal and occipital region consistent with old infarct, present on the previous study. No mass effect or midline shift. No abnormal extra-axial fluid collections. No acute intracranial hemorrhage. There is mucosal thickening throughout the paranasal sinuses with opacification of the right mastoid air cells. Dense vascular calcifications.  IMPRESSION: No acute intracranial  abnormalities. Chronic atrophy. 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 Mucosal thickening throughout the paranasal sinuses with opacification of the right mastoid air cells.   Electronically Signed   By: Burman Nieves M.D.   On: 05/03/2013 00:45   Dg Chest Port 1 View  05/03/2013   CLINICAL DATA:  Sepsis.  EXAM: PORTABLE CHEST - 1 VIEW  COMPARISON:  DG CHEST 2 VIEW dated 05/03/2013; DG CHEST 2 VIEW dated 04/07/2013  FINDINGS: There is no evidence of pulmonary edema, consolidation, pneumothorax, nodule or pleural fluid. The heart size is normal. There is stable prominence of central pulmonary arteries. No bony abnormalities are seen.  IMPRESSION: No active disease.   Electronically Signed   By: Irish Lack M.D.   On: 05/03/2013 19:58    Scheduled Meds: . aspirin  81 mg Oral Daily  . atorvastatin  20 mg Oral q1800  . ceFEPime (MAXIPIME) IV  1 g Intravenous Q24H  . enoxaparin (LOVENOX) injection  30 mg Subcutaneous Q24H  . insulin aspart  0-9 Units Subcutaneous 6 times per day  . irbesartan  300 mg Oral Daily  . metoprolol tartrate  12.5 mg Oral BID  . [START ON 05/05/2013] vancomycin  500 mg Intravenous Q48H   Continuous Infusions: . sodium chloride 1,000 mL (05/04/13 0659)    Principal Problem:   Sepsis Active Problems:   Weakness   SIRS (systemic inflammatory response syndrome)   Leukocytosis   Altered mental status   Acute renal failure   Elevated troponin   Elevated d-dimer   Otitis media   Essential hypertension, benign   Type II or unspecified type diabetes mellitus without mention of complication, not stated as uncontrolled   Anemia   Acute myocardial infarction, subendocardial infarction, initial episode of care    Time spent: 35 min    Jeralyn Bennett  [facility] Pager [phone]. If 7PM-7AM, please contact night-coverage at [email or web address], password [facility] 05/04/2013, 2:04 PM  LOS: 2 days

## 2013-05-04 NOTE — Progress Notes (Signed)
Pt is on Regular diet even though she is diabetic because she has such poor intake CBG with insulin coverage AC and just CBG @  [facility]

## 2013-05-04 NOTE — Progress Notes (Signed)
HPI: 80 year old female with no prior cardiac history for evaluation of non-ST elevation myocardial infarction. Patient cannot converse as she is severely hearing impaired. History obtained with the assistance of her children. The patient has limited activities at home because of weakness. Her symptoms have worsened over the past 2 weeks. She has generalized weakness and decreased appetite. She had fever and drainage from her right ear. She has been admitted and diagnosed with sepsis/mastoiditis. She has been started on antibiotics. Cardiac enzymes abnormal and cardiology asked to evaluate. There apparently is no dyspnea or chest pain. She has had a nonproductive cough and also complained of right hip pain.  NSTEMI, weakness and sepsis, EKG no ST changes, treating for mastoiditis, sepsis and dehydration.  If improves would consider a nuc study is plan.  Echo pending  Subjective: Sleeping in no acute distress  Objective: Vital signs in last 24 hours: Temp:  [97.3 F (36.3 C)-99.1 F (37.3 C)] 97.5 F (36.4 C) (04/13 1150) Pulse Rate:  [63-86] 66 (04/13 1150) Resp:  [13-27] 26 (04/13 1150) BP: (118-147)/(58-70) 129/70 mmHg (04/13 1150) SpO2:  [99 %-100 %] 100 % (04/13 1150) Weight:  [114 lb 10.2 oz (52 kg)] 114 lb 10.2 oz (52 kg) (04/13 0408) Weight change: 6 lb 13.4 oz (3.1 kg) Last BM Date: 05/02/13 Intake/Output from previous day: +1262 ( +1537 since admit)  Wt up 7 pounds 04/12 0701 - 04/13 0700 In: 1762.5 [I.V.:1612.5; IV Piggyback:150] Out: 425 [Urine:425] Intake/Output this shift: Total I/O In: 350 [I.V.:300; IV Piggyback:50] Out: 180 [Urine:180]  PE: General:Pleasant affect, NAD, very hard of hearing Skin:Warm and dry, brisk capillary refill HEENT:normocephalic, sclera clear, mucus membranes moist Neck:supple, no JVD  Heart:S1S2 RRR without murmur, gallup, rub or click Lungs:clear without rales, rhonchi, or wheezes BJY:NWGNAbd:soft, non tender, + BS, do not palpate liver  spleen or masses Ext:no lower ext edema, 2+ pedal pulses, 2+ radial pulses Neuro:alert and oriented, MAE, follows commands, + facial symmetry  tele:  SR Lab Results:  Recent Labs  05/02/13 2350 05/04/13 0314  WBC 26.9* 14.6*  HGB 9.3* 8.5*  HCT 27.4* 25.2*  PLT 550* 458*   BMET  Recent Labs  05/02/13 2350 05/04/13 0314  NA 132* 134*  K 5.1 4.3  CL 90* 98  CO2 26 21  GLUCOSE 301* 124*  BUN 38* 26*  CREATININE 1.70* 1.07  CALCIUM 10.0 9.1    Recent Labs  05/03/13 1320 05/03/13 1930  TROPONINI 0.67* 0.44*    No results found for this basename: CHOL, HDL, LDLCALC, LDLDIRECT, TRIG, CHOLHDL   No results found for this basename: HGBA1C     Lab Results  Component Value Date   TSH 1.020 12/05/2012    Studies/Results: Dg Chest 2 View  05/03/2013   CLINICAL DATA:  Hypertension and diabetes  EXAM: CHEST  2 VIEW  COMPARISON:  04/07/2013  FINDINGS: The heart size and mediastinal contours are within normal limits. No airspace consolidation identified. No pleural effusion or edema. Scoliosis deformity involves the thoracic spine. There is multi level degenerative disc disease noted. The visualized skeletal structures are unremarkable.  IMPRESSION: No active cardiopulmonary disease.   Electronically Signed   By: Signa Kellaylor  Stroud M.D.   On: 05/03/2013 00:39   Dg Pelvis 1-2 Views  05/03/2013   CLINICAL DATA:  Hip pain for 3 days  EXAM: PELVIS - 1-2 VIEW  COMPARISON:  None  FINDINGS: Bones are osteopenic diminishing sensitivity for nondisplaced fracture. Severe osteoarthritis with mild protrusio deformity  involves the right hip. There is moderate osteoarthritis involving the left hip. No acute fracture or subluxation identified. The patient is status post previous posterior decompression and hardware fusion of the lumbar spine.  IMPRESSION: 1. No acute findings. 2. Moderate to severe osteoarthritis, right greater than left.   Electronically Signed   By: Signa Kell M.D.   On:  05/03/2013 00:43   Ct Head Wo Contrast  05/03/2013   CLINICAL DATA:  Left hip pain. Decreased responsiveness and increased fatigue.  EXAM: CT HEAD WITHOUT CONTRAST  TECHNIQUE: Contiguous axial images were obtained from the base of the skull through the vertex without intravenous contrast.  COMPARISON:  11/07/2012  FINDINGS: Diffuse cerebral atrophy. Ventricular dilatation consistent with central atrophy. Low-attenuation change in the right posterior parietal and occipital region consistent with old infarct, present on the previous study. No mass effect or midline shift. No abnormal extra-axial fluid collections. No acute intracranial hemorrhage. There is mucosal thickening throughout the paranasal sinuses with opacification of the right mastoid air cells. Dense vascular calcifications.  IMPRESSION: No acute intracranial abnormalities. Chronic atrophy. 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 Mucosal thickening throughout the paranasal sinuses with opacification of the right mastoid air cells.   Electronically Signed   By: Burman Nieves M.D.   On: 05/03/2013 00:45   Dg Chest Port 1 View  05/03/2013   CLINICAL DATA:  Sepsis.  EXAM: PORTABLE CHEST - 1 VIEW  COMPARISON:  DG CHEST 2 VIEW dated 05/03/2013; DG CHEST 2 VIEW dated 04/07/2013  FINDINGS: There is no evidence of pulmonary edema, consolidation, pneumothorax, nodule or pleural fluid. The heart size is normal. There is stable prominence of central pulmonary arteries. No bony abnormalities are seen.  IMPRESSION: No active disease.   Electronically Signed   By: Irish Lack M.D.   On: 05/03/2013 19:58    Medications: I have reviewed the patient's current medications. Scheduled Meds: . aspirin  81 mg Oral Daily  . atorvastatin  20 mg Oral q1800  . ceFEPime (MAXIPIME) IV  1 g Intravenous Q24H  . enoxaparin (LOVENOX) injection  30 mg Subcutaneous Q24H  . insulin aspart  0-9 Units Subcutaneous 6 times per day  . irbesartan  300 mg Oral Daily    . metoprolol tartrate  12.5 mg Oral BID  . [START ON 05/05/2013] vancomycin  500 mg Intravenous Q48H   Continuous Infusions: . sodium chloride 1,000 mL (05/04/13 0659)   PRN Meds:.acetaminophen, acetaminophen, alum & mag hydroxide-simeth, HYDROmorphone (DILAUDID) injection, ondansetron (ZOFRAN) IV, ondansetron, oxyCODONE  Assessment/Plan: Principal Problem:   Sepsis--on cefepime and vanc., fluids at 75 cc/hr ( mastoiditis), may need to decrease to 50 currently no failure. Active Problems:   Weakness   SIRS (systemic inflammatory response syndrome)   Leukocytosis   Altered mental status   Acute renal failure--Cr 1.07 down from 1.70   Elevated troponin   Elevated d-dimer   Otitis media   Essential hypertension, benign, BP stable   Type II or unspecified type diabetes mellitus without mention of complication, not stated as uncontrolled   Anemia--at 8.5 hgb down from 9.3 on admit   Acute myocardial infarction, subendocardial infarction, initial episode of care, pk troponin 1.26 now down to 0.44-- on ASA,Statin, BB and avapro   Vte prophylaxis on lovenox.   LOS: 2 days   Time spent with pt. :15 minutes. Nada Boozer  Nurse Practitioner Certified Pager [phone] or after 5pm and on weekends call [phone] 05/04/2013, 12:07 PM  The patient was seen, examined and  discussed with Nada BoozerLaura Ingold, NP and agree as above.  1. NSTEMI - in the settings of sepsis/mastoiditis, max troponin 0.67--> 0.4, ECG no changes, the patient is completely asymptomatic, given her age and comorbidities and no symptoms we would opt for medical therapy unless any changes in her status. We would order an echocardiogram for now. If she makes significant improvement we could consider a nuclear study for risk stratification. Otherwise we'll plan medical therapy long time. Check echocardiogram for LV function.Treat with aspirin, Low-dose metoprolol and statin.   2. sepsis/mastoiditis-continue antibiotics. Management per  primary care.  3. renal insufficiency-There is most likely a prerenal component. Agree with gentle hydration. Improving now. 4. normocytic anemia-further evaluation per primary care.  5. right hip pain-management per primary care.  6. diabetes mellitus-continue present medications.  7. hypertension-continue present medications except discontinue HCTZ given dehydration.  Lars MassonKatarina H Pamalee Marcoe 05/04/2013

## 2013-05-04 NOTE — Progress Notes (Signed)
Pt's family here concerned about her poor po intake. Order placed for Dietician to see pt for nutritional supplement.

## 2013-05-04 NOTE — Progress Notes (Signed)
ANTIBIOTIC CONSULT NOTE - FOLLOW UP  Pharmacy Consult for vancomycin, cefepime Indication: rule out sepsis  No Known Allergies Patient Measurements: Height: 5' (152.4 cm) Weight: 114 lb 10.2 oz (52 kg) IBW/kg (Calculated) : 45.5 Vital Signs: Temp: 97.5 F (36.4 C) (04/13 1150) Temp src: Oral (04/13 1150) BP: 129/70 mmHg (04/13 1150) Pulse Rate: 68 (04/13 1421) Intake/Output from previous day: 04/12 0701 - 04/13 0700 In: 1762.5 [I.V.:1612.5; IV Piggyback:150] Out: 425 [Urine:425] Intake/Output from this shift: Total I/O In: 350 [I.V.:300; IV Piggyback:50] Out: 180 [Urine:180] Labs:  Recent Labs  05/02/13 2350 05/04/13 0314  WBC 26.9* 14.6*  HGB 9.3* 8.5*  PLT 550* 458*  CREATININE 1.70* 1.07   Estimated Creatinine Clearance: 26.1 ml/min (by C-G formula based on Cr of 1.07). No results found for this basename: VANCOTROUGH, VANCOPEAK, VANCORANDOM, GENTTROUGH, GENTPEAK, GENTRANDOM, TOBRATROUGH, TOBRAPEAK, TOBRARND, AMIKACINPEAK, AMIKACINTROU, AMIKACIN,  in the last 72 hours   Microbiology: Recent Results (from the past 720 hour(s))  CULTURE, BLOOD (ROUTINE X 2)     Status: None   Collection Time    05/03/13 12:40 AM      Result Value Ref Range Status   Specimen Description BLOOD LEFT ARM   Final   Special Requests BOTTLES DRAWN AEROBIC AND ANAEROBIC 5ML EACH   Final   Culture  Setup Time     Final   Value: 05/03/2013 01:30     Performed at [facility]   Culture     Final   Value:        BLOOD CULTURE RECEIVED NO GROWTH TO DATE CULTURE WILL BE HELD FOR 5 DAYS BEFORE ISSUING A FINAL NEGATIVE REPORT     Performed at [facility]   Report Status PENDING   Incomplete  CULTURE, BLOOD (ROUTINE X 2)     Status: None   Collection Time    05/03/13 12:45 AM      Result Value Ref Range Status   Specimen Description BLOOD RIGHT ARM   Final   Special Requests BOTTLES DRAWN AEROBIC AND ANAEROBIC 5ML EACH   Final   Culture  Setup Time     Final   Value:  05/03/2013 01:30     Performed at [facility]   Culture     Final   Value:        BLOOD CULTURE RECEIVED NO GROWTH TO DATE CULTURE WILL BE HELD FOR 5 DAYS BEFORE ISSUING A FINAL NEGATIVE REPORT     Performed at [facility]   Report Status PENDING   Incomplete  MRSA PCR SCREENING     Status: None   Collection Time    05/03/13  4:06 AM      Result Value Ref Range Status   MRSA by PCR NEGATIVE  NEGATIVE Final   Comment:            The GeneXpert MRSA Assay (FDA     approved for NASAL specimens     only), is one component of a     comprehensive MRSA colonization     surveillance program. It is not     intended to diagnose MRSA     infection nor to guide or     monitor treatment for     MRSA infections.    Anti-infectives   Start     Dose/Rate Route Frequency Ordered Stop   05/05/13 0600  vancomycin (VANCOCIN) 500 mg in sodium chloride 0.9 % 100 mL IVPB  Status:  Discontinued  500 mg 100 mL/hr over 60 Minutes Intravenous Every 48 hours 05/03/13 0629 05/04/13 1429   05/04/13 1500  vancomycin (VANCOCIN) 500 mg in sodium chloride 0.9 % 100 mL IVPB     500 mg 100 mL/hr over 60 Minutes Intravenous Every 24 hours 05/04/13 1429     05/03/13 0800  ceFEPIme (MAXIPIME) 1 g in dextrose 5 % 50 mL IVPB     1 g 100 mL/hr over 30 Minutes Intravenous Every 24 hours 05/03/13 0629     05/03/13 0630  vancomycin (VANCOCIN) 500 mg in sodium chloride 0.9 % 100 mL IVPB     500 mg 100 mL/hr over 60 Minutes Intravenous  Once 05/03/13 0629 05/03/13 1026   05/03/13 0145  Ampicillin-Sulbactam (UNASYN) 3 g in sodium chloride 0.9 % 100 mL IVPB     3 g 100 mL/hr over 60 Minutes Intravenous  Once 05/03/13 0143 05/03/13 0352      Assessment: 89 YOF on vancomycin and cefepime day #2 for r/o sepsis from presumed otitis media/mastoiditis. Patient's SCr is improving back to baseline (1.07). She is afebrile and remains with leukocytosis with elevated lactic acid.   Goal of Therapy:   Vancomycin trough level 15-20 mcg/ml  Plan:  Increase vancomycin to 500mg  IV q24h. Continue cefepime 1g IV q24h. Monitor renal function, trough levels as needed.   Link SnufferJessica Karista Aispuro, PharmD, BCPS Clinical Pharmacist [phone] 05/04/2013,2:29 PM

## 2013-05-04 NOTE — Evaluation (Signed)
Physical Therapy Evaluation Patient Details Name: Kathleen Weeks MRN: [id number] DOB: 07/13/1922 Today's Date: 05/04/2013   History of Present Illness  Kathleen Weeks is a 80 y.o. female with a history of HTN and DM2 who [facility] due to progressive weakness and poor intake of foods and liquids over the past 3 days. Pt with sepsis, mastoiditis, NSTEMI  Clinical Impression  Upon entering room pt calling out for bedpan. With visual and verbal cueing pt able to walk to bathroom with RW and perform own self care then ambulate in room before sitting in chair. Pt with very limited verbalizations and unsure how much pt could hear so began using written communication which pt provided answers for PLOF. Granddgtr present end of session stating pt very awnry and could hear part of what I said but just didn't want to answer. Family stating pt is like this at home and will just not talk to them and they have to bribe her to do things because she will only move or do when she wants to. Family provides 24hr care and will be able to continue at DC they also have all necessary DME. Pt with decreased activity tolerance and strength and would benefit from acute trial of therapy pending pt willingness to participate. RN aware of all above and recommend daily mobility with nursing staff.     Follow Up Recommendations No PT follow up;Supervision/Assistance - 24 hour    Equipment Recommendations  None recommended by PT    Recommendations for Other Services       Precautions / Restrictions Precautions Precautions: Fall      Mobility  Bed Mobility Overal bed mobility: Modified Independent                Transfers Overall transfer level: Needs assistance   Transfers: Sit to/from Stand Sit to Stand: Min guard            Ambulation/Gait Ambulation/Gait assistance: Min assist Ambulation Distance (Feet): 25 Feet (10', 25') Assistive device: Rolling walker (2 wheeled) Gait  Pattern/deviations: Step-through pattern;Decreased stride length;Trunk flexed   Gait velocity interpretation: at or above normal speed for age/gender General Gait Details: pt pushing RW too far forward with flexed posture and running into objects, multimodal cues for position in RW, direction and safety  Stairs            Wheelchair Mobility    Modified Rankin (Stroke Patients Only)       Balance Overall balance assessment: History of Falls;Needs assistance   Sitting balance-Leahy Scale: Good       Standing balance-Leahy Scale: Poor                               Pertinent Vitals/Pain HR 68 sats 100% on RA No pain    Home Living Family/patient expects to be discharged to:: Private residence Living Arrangements: Children Available Help at Discharge: Family;Available 24 hours/day Type of Home: House Home Access: Stairs to enter Entrance Stairs-Rails: None Entrance Stairs-Number of Steps: 3 Home Layout: One level Home Equipment: Walker - 4 wheels;Shower seat      Prior Function Level of Independence: Needs assistance   Gait / Transfers Assistance Needed: pt gets up and walks with her rollator  ADL's / Homemaking Assistance Needed: church aide comes 3-4x/wk to help with bathing        Hand Dominance        Extremity/Trunk  Assessment   Upper Extremity Assessment: Generalized weakness           Lower Extremity Assessment: Generalized weakness      Cervical / Trunk Assessment: Kyphotic  Communication   Communication: HOH  Cognition Arousal/Alertness: Awake/alert Behavior During Therapy: Flat affect Overall Cognitive Status: Difficult to assess                      General Comments      Exercises        Assessment/Plan    PT Assessment Patient needs continued PT services  PT Diagnosis Difficulty walking;Generalized weakness   PT Problem List Decreased strength;Decreased activity tolerance;Decreased  balance;Decreased knowledge of use of DME  PT Treatment Interventions Gait training;Stair training;Functional mobility training;Therapeutic activities;Patient/family education;DME instruction   PT Goals (Current goals can be found in the Care Plan section) Acute Rehab PT Goals Patient Stated Goal: go home PT Goal Formulation: With family Time For Goal Achievement: 05/11/13 Potential to Achieve Goals: Fair    Frequency Min 3X/week   Barriers to discharge        Co-evaluation               End of Session Equipment Utilized During Treatment: Gait belt Activity Tolerance: Patient tolerated treatment well Patient left: in chair;with call bell/phone within reach;with chair alarm set;with family/visitor present Nurse Communication: Mobility status         Time: 1340-1418 PT Time Calculation (min): 38 min   Charges:   PT Evaluation $Initial PT Evaluation Tier I: 1 Procedure PT Treatments $Therapeutic Activity: 23-37 mins   PT G Codes:          [name] B [name] 05/04/2013, 2:25 PM Delaney MeigsMaija Tabor Denym Rahimi, PT [phone]

## 2013-05-04 NOTE — Progress Notes (Signed)
  Echocardiogram 2D Echocardiogram has been performed.  Real ConsChristy F Joyelle Siedlecki 05/04/2013, 4:55 PM

## 2013-05-05 LAB — CBC
HCT: 23.5 % — ABNORMAL LOW (ref 36.0–46.0)
Hemoglobin: 7.9 g/dL — ABNORMAL LOW (ref 12.0–15.0)
MCH: 31.7 pg (ref 26.0–34.0)
MCHC: 33.6 g/dL (ref 30.0–36.0)
MCV: 94.4 fL (ref 78.0–100.0)
PLATELETS: 427 10*3/uL — AB (ref 150–400)
RBC: 2.49 MIL/uL — ABNORMAL LOW (ref 3.87–5.11)
RDW: 13.8 % (ref 11.5–15.5)
WBC: 9.4 10*3/uL (ref 4.0–10.5)

## 2013-05-05 LAB — GLUCOSE, CAPILLARY
Glucose-Capillary: 180 mg/dL — ABNORMAL HIGH (ref 70–99)
Glucose-Capillary: 112 mg/dL — ABNORMAL HIGH (ref 70–99)
Glucose-Capillary: 116 mg/dL — ABNORMAL HIGH (ref 70–99)

## 2013-05-05 LAB — BASIC METABOLIC PANEL
BUN: 26 mg/dL — AB (ref 6–23)
CALCIUM: 8.7 mg/dL (ref 8.4–10.5)
CO2: 20 mEq/L (ref 19–32)
CREATININE: 1.02 mg/dL (ref 0.50–1.10)
Chloride: 102 mEq/L (ref 96–112)
GFR, EST AFRICAN AMERICAN: 55 mL/min — AB (ref >90)
GFR, EST NON AFRICAN AMERICAN: 48 mL/min — AB (ref >90)
Glucose, Bld: 243 mg/dL — ABNORMAL HIGH (ref 70–99)
POTASSIUM: 4.8 meq/L (ref 3.7–5.3)
Sodium: 138 mEq/L (ref 137–147)

## 2013-05-05 MED ORDER — INSULIN GLARGINE 100 UNIT/ML ~~LOC~~ SOLN
12.0000 [IU] | Freq: Every day | SUBCUTANEOUS | Status: DC
  Administered 2013-05-05: 12 [IU] via SUBCUTANEOUS
  Filled 2013-05-05 (×3): qty 0.12

## 2013-05-05 MED ORDER — INSULIN ASPART 100 UNIT/ML ~~LOC~~ SOLN: 0.0000 [IU] | Freq: Every day | SUBCUTANEOUS | Status: DC

## 2013-05-05 MED ORDER — METOPROLOL TARTRATE 25 MG PO TABS
25.0000 mg | ORAL_TABLET | Freq: Two times a day (BID) | ORAL | Status: DC
  Administered 2013-05-05 – 2013-05-07 (×5): 25 mg via ORAL
  Filled 2013-05-05 (×6): qty 1

## 2013-05-05 MED ORDER — CIPROFLOXACIN-DEXAMETHASONE 0.3-0.1 % OT SUSP
4.0000 [drp] | Freq: Two times a day (BID) | OTIC | Status: DC
  Administered 2013-05-05 – 2013-05-07 (×5): 4 [drp] via OTIC
  Filled 2013-05-05: qty 7.5

## 2013-05-05 MED ORDER — INSULIN ASPART 100 UNIT/ML ~~LOC~~ SOLN
0.0000 [IU] | Freq: Three times a day (TID) | SUBCUTANEOUS | Status: DC
  Administered 2013-05-05: 3 [IU] via SUBCUTANEOUS
  Administered 2013-05-06: 2 [IU] via SUBCUTANEOUS
  Administered 2013-05-06: 3 [IU] via SUBCUTANEOUS

## 2013-05-05 MED ORDER — GLUCERNA SHAKE PO LIQD
237.0000 mL | Freq: Two times a day (BID) | ORAL | Status: DC
  Administered 2013-05-06 – 2013-05-07 (×3): 237 mL via ORAL

## 2013-05-05 MED ORDER — CEFUROXIME AXETIL 250 MG PO TABS
250.0000 mg | ORAL_TABLET | Freq: Two times a day (BID) | ORAL | Status: DC
  Administered 2013-05-05 – 2013-05-07 (×5): 250 mg via ORAL
  Filled 2013-05-05 (×7): qty 1

## 2013-05-05 NOTE — Progress Notes (Signed)
Patient Name: Garry HeaterMary W Bostrom Date of Encounter: 05/05/2013  Principal Problem:   Sepsis Active Problems:   Weakness   SIRS (systemic inflammatory response syndrome)   Leukocytosis   Altered mental status   Acute renal failure   Elevated troponin   Elevated d-dimer   Otitis media   Essential hypertension, benign   Type II or unspecified type diabetes mellitus without mention of complication, not stated as uncontrolled   Anemia   Acute myocardial infarction, subendocardial infarction, initial episode of care   Length of Stay: 3  SUBJECTIVE  Remains asymptomatic from cardiac point of view (as far as I can tell - she is very hard to communicate with). Appears comfortable.  CURRENT MEDS . aspirin  81 mg Oral Daily  . atorvastatin  20 mg Oral q1800  . cefUROXime  250 mg Oral BID WC  . enoxaparin (LOVENOX) injection  30 mg Subcutaneous Q24H  . insulin aspart  0-15 Units Subcutaneous TID WC  . insulin aspart  0-5 Units Subcutaneous QHS  . insulin glargine  12 Units Subcutaneous Daily  . irbesartan  300 mg Oral Daily  . metoprolol tartrate  12.5 mg Oral BID    OBJECTIVE   Intake/Output Summary (Last 24 hours) at 05/05/13 0825 Last data filed at 05/05/13 0415  Gross per 24 hour  Intake   1445 ml  Output    425 ml  Net   1020 ml   Filed Weights   05/03/13 0459 05/04/13 0408  Weight: 48.9 kg (107 lb 12.9 oz) 52 kg (114 lb 10.2 oz)    PHYSICAL EXAM Filed Vitals:   05/04/13 2047 05/04/13 2214 05/04/13 2357 05/05/13 0807  BP: 136/59 131/61 128/90 150/74  Pulse: 75 82  65  Temp: 97.4 F (36.3 C)  97.6 F (36.4 C) 97 F (36.1 C)  TempSrc: Oral  Oral Axillary  Resp: 22  15 17   Height:      Weight:      SpO2: 100%  100% 92%   General: Alert, oriented x3, no distress Head: no evidence of trauma, PERRL, EOMI, no exophtalmos or lid lag, no myxedema, no xanthelasma; normal ears, nose and oropharynx Neck: normal jugular venous pulsations and no hepatojugular reflux;  brisk carotid pulses without delay and no carotid bruits Chest: clear to auscultation, no signs of consolidation by percussion or palpation, normal fremitus, symmetrical and full respiratory excursions Cardiovascular: normal position and quality of the apical impulse, regular rhythm, normal first and second heart sounds, no rubs or gallops, 1/6 holosystolic murmur at LLSB Abdomen: no tenderness or distention, no masses by palpation, no abnormal pulsatility or arterial bruits, normal bowel sounds, no hepatosplenomegaly Extremities: no clubbing, cyanosis or edema; 2+ radial, ulnar and brachial pulses bilaterally; 2+ right femoral, posterior tibial and dorsalis pedis pulses; 2+ left femoral, posterior tibial and dorsalis pedis pulses; no subclavian or femoral bruits Neurological: grossly nonfocal except HOH  LABS  CBC  Recent Labs  05/02/13 2350 05/04/13 0314 05/05/13 0225  WBC 26.9* 14.6* 9.4  NEUTROABS 23.9*  --   --   HGB 9.3* 8.5* 7.9*  HCT 27.4* 25.2* 23.5*  MCV 95.8 93.7 94.4  PLT 550* 458* 427*   Basic Metabolic Panel  Recent Labs  05/04/13 0314 05/05/13 0225  NA 134* 138  K 4.3 4.8  CL 98 102  CO2 21 20  GLUCOSE 124* 243*  BUN 26* 26*  CREATININE 1.07 1.02  CALCIUM 9.1 8.7   Liver Function Tests No results found for  this basename: AST, ALT, ALKPHOS, BILITOT, PROT, ALBUMIN,  in the last 72 hours No results found for this basename: LIPASE, AMYLASE,  in the last 72 hours Cardiac Enzymes  Recent Labs  05/03/13 0034 05/03/13 1320 05/03/13 1930  TROPONINI 1.26* 0.67* 0.44*   BNP No components found with this basename: POCBNP,  D-Dimer  Recent Labs  05/03/13 0134  DDIMER 0.70*   Hemoglobin A1C  Recent Labs  05/04/13 0314  HGBA1C 7.1*   Fasting Lipid Panel No results found for this basename: CHOL, HDL, LDLCALC, TRIG, CHOLHDL, LDLDIRECT,  in the last 72 hours Thyroid Function Tests No results found for this basename: TSH, T4TOTAL, FREET3, T3FREE,  THYROIDAB,  in the last 72 hours  Radiology Studies Imaging results have been reviewed and Dg Chest Port 1 View  05/03/2013   CLINICAL DATA:  Sepsis.  EXAM: PORTABLE CHEST - 1 VIEW  COMPARISON:  DG CHEST 2 VIEW dated 05/03/2013; DG CHEST 2 VIEW dated 04/07/2013  FINDINGS: There is no evidence of pulmonary edema, consolidation, pneumothorax, nodule or pleural fluid. The heart size is normal. There is stable prominence of central pulmonary arteries. No bony abnormalities are seen.  IMPRESSION: No active disease.   Electronically Signed   By: Irish LackGlenn  Yamagata M.D.   On: 05/03/2013 19:58    TELE NSR  ECG NSR, no repol abnormalities  ASSESSMENT AND PLAN Continue conservative management for very small NSTEMI. Echo shows hyperdynamic EF without regional abnormalities. Risk stratification with a Lexiscan Myoview is not unreasonable, but also non-urgent. Can even be pursued as an outpatient. Note moderately severe anemia, which is a further disincentive to invasive cardiac management. BP a little high. Increase beta blocker gently.   Thurmon FairMihai Waylon Koffler, MD, [facility] [phone] office [phone] pager 05/05/2013 8:25 AM

## 2013-05-05 NOTE — Progress Notes (Addendum)
[facility] PROGRESS NOTE  Kathleen DiesMary W Janssens ZOX:[id number] DOB: 01/01/1923 DOA: 05/02/2013 PCP: Gwynneth AlimentSANDERS,ROBYN N, MD  Interim Discharge Summary Patient is a pleasant 80 year old female currently residing in the community was admitted to the medicine service on 05/03/2013, presenting with failure to thrive, functional decline, poor oral intake, decreased responsiveness and overall generalized weakness.  Patient was found to be septic, supported by her initial lab work showing a white count of 26,900, creatinine 1.7 and BUN of 38, troponin 1.26 and lactic acid of 2.59. Family members have reported discharge from her right ear. CT scan of brain  did not reveal acute intracranial abnormalities, however was found to have mucosal thickening throughout the paranasal sinuses with opacification of right mastoid air cells. It was felt that source of infection could be mastoiditis. She was started on empiric IV antibiotic therapy with vancomycin and cefepime. Patient was also administered IV fluids. With regard to her elevated troponin, it was felt this represented demand ischemia in setting of sepsis, cardiology was consulted. Patient showed gradual clinical improvement,  as labs showed resolution of acute kidney injury. By 05/05/2013 her creatinine had come down to 1.02 with BUN of 26. Her white count had also normalized by 05/05/2013 to 9,400. She remained afebrile for over 24 hours. On this date I discontinued IV antimicrobial therapy, transition to Ceftin. I noted that she continue have drainage from her right ear. I spoke with Dr Janeece RiggersSu Philomena DohenyWooi Teoh of the ENT. He reviewed the CT scan of brain performed on 05/03/2013 and did not feel she required immediate surgical interventions. He recommended that patient see them at the office on the day of discharge so that she may undergo drainage of external auditory canal. I spoke with family members on 05/05/2013, updated on patient's condition and the need to follow up with  ENT on day of discharge. Given patient's improvement, transfer orders were written for MedSurg.                                                                                                Plan: Please have patient see Dr Janeece RiggersSu Philomena DohenyWooi Teoh on discharge  Assessment/Plan: 1. Sepsis, present on admission -Evidence by a white count of 26,900, lactate of 2.59, acute kidney injury, encephalopathy -Chest x-ray showing no acute cardiopulmonary disease, urinalysis unremarkable -Possible source of infection otitis media/mastioditis  -Labs improving -Transitioning to Ceftin 250 mg PO BID -Tranfser out of SDU 2.  NSTEMI. -Patient initially presented with a troponin 1.26, coming down to 0.67 -Patient having elevated creatinine 1.7 likely leading to decreased clearance -I. suspect secondary to demand ischemia in setting of sepsis -Transthoracic echocardiogram performed on 05/04/2013 show an ejection fraction of 65-70% with grade 1 diastolic dysfunction -Cardiology consulted 3.  Acute kidney injury -Labs showing an improvement to kidney function as Creatinine trending down to 1.07 from 1.7 -Likely secondary to sepsis/hypovolemia -Stopping IV fluids, encourage oral intake 4.  Acute encephalopathy -Likely secondary to sepsis -Family members reporting a steep functional decline over the past 2-3 days prior to admission -Improving as she appears for alert today 5. Elevated d-dimer -Patient having d-dimer 0.7 -Nonspecific, could be related to sepsis -Patient have elevated troponins, attempted to perform VQ scan which was unsuccessful given lack of patient cooperation -Patient having O2 sats in the upper 90s on room air 6. Type 2 diabetes mellitus -Blood sugars improving, insulin glargine 12 units subcutaneous  daily started on 05/05/2013 -Accu-Cheks with sliding scale coverage  Code Status: Full code  Family Communication: Spoke to patient's daughter over telephone today Disposition Plan: Clinically improved, will transfer to Med/Surg   Procedures:  Pending transthoracic echocardiogram  Antibiotics:  Vancomycin IV  Cefepime IV  HPI/Subjective:  Patient seems improved, she is following simple commands for me, awke and alert.   Objective: Filed Vitals:   05/05/13 1600  BP: 141/65  Pulse:   Temp: 98.5 F (36.9 C)  Resp: 20    Intake/Output Summary (Last 24 hours) at 05/05/13 1841 Last data filed at 05/05/13 1013  Gross per 24 hour  Intake    365 ml  Output    225 ml  Net    140 ml   Filed Weights   05/03/13 0459 05/04/13 0408  Weight: 48.9 kg (107 lb 12.9 oz) 52 kg (114 lb 10.2 oz)    Exam:   General:  Looks better today, awake and alert, no distress  ENT: There continues to be drainage from patient's right ear, purulent material with an external auditory canal, could not see the tympanic membrane  Cardiovascular: Regular rate rhythm normal S1-S2  Respiratory: Clear to auscultation bilaterally  Abdomen: Soft nontender nondistended  Musculoskeletal: No edema, muscle atrophy present   Data Reviewed: Basic Metabolic Panel:  Recent Labs Lab 05/02/13 2350 05/04/13 0314 05/05/13 0225  NA 132* 134* 138  K 5.1 4.3 4.8  CL 90* 98 102  CO2 26 21 20   GLUCOSE 301* 124* 243*  BUN 38* 26* 26*  CREATININE 1.70* 1.07 1.02  CALCIUM 10.0 9.1 8.7   Liver Function Tests: No results found for this basename: AST, ALT, ALKPHOS, BILITOT, PROT, ALBUMIN,  in the last 168 hours No results found for this basename: LIPASE, AMYLASE,  in the last 168 hours No results found for this basename: AMMONIA,  in the last 168 hours CBC:  Recent Labs Lab 05/02/13 2350 05/04/13 0314 05/05/13 0225  WBC 26.9* 14.6* 9.4  NEUTROABS 23.9*  --   --   HGB 9.3* 8.5* 7.9*  HCT 27.4*  25.2* 23.5*  MCV 95.8 93.7 94.4  PLT 550* 458* 427*   Cardiac Enzymes:  Recent Labs Lab 05/03/13 0034 05/03/13 1320  05/03/13 1930  TROPONINI 1.26* 0.67* 0.44*   BNP (last 3 results)  Recent Labs  11/07/12 1523  PROBNP 395.9   CBG:  Recent Labs Lab 05/04/13 0831 05/04/13 1214 05/04/13 1723 05/05/13 1152 05/05/13 1655  GLUCAP 104* 118* 218* 180* 112*    Recent Results (from the past 240 hour(s))  CULTURE, BLOOD (ROUTINE X 2)     Status: None   Collection Time    05/03/13 12:40 AM      Result Value Ref Range Status   Specimen Description BLOOD LEFT ARM   Final   Special Requests BOTTLES DRAWN AEROBIC AND ANAEROBIC EACH   Final   Culture  Setup Time     Final   Value: 05/03/2013 01:30     Performed at [facility]   Culture     Final   Value:        BLOOD CULTURE RECEIVED NO GROWTH TO DATE CULTURE WILL BE HELD FOR 5 DAYS BEFORE ISSUING A FINAL NEGATIVE REPORT     Performed at [facility]   Report Status PENDING   Incomplete  CULTURE, BLOOD (ROUTINE X 2)     Status: None   Collection Time    05/03/13 12:45 AM      Result Value Ref Range Status   Specimen Description BLOOD RIGHT ARM   Final   Special Requests BOTTLES DRAWN AEROBIC AND ANAEROBIC EACH   Final   Culture  Setup Time     Final   Value: 05/03/2013 01:30     Performed at [facility]   Culture     Final   Value:        BLOOD CULTURE RECEIVED NO GROWTH TO DATE CULTURE WILL BE HELD FOR 5 DAYS BEFORE ISSUING A FINAL NEGATIVE REPORT     Performed at [facility]   Report Status PENDING   Incomplete  MRSA PCR SCREENING     Status: None   Collection Time    05/03/13  4:06 AM      Result Value Ref Range Status   MRSA by PCR NEGATIVE  NEGATIVE Final   Comment:            The GeneXpert MRSA Assay (FDA     approved for NASAL specimens     only), is one component of a     comprehensive MRSA colonization     surveillance program. It is not     intended to diagnose  MRSA     infection nor to guide or     monitor treatment for     MRSA infections.     Studies: No results found.  Scheduled Meds: . aspirin  81 mg Oral Daily  . atorvastatin  20 mg Oral q1800  . cefUROXime  250 mg Oral BID WC  . ciprofloxacin-dexamethasone  4 drop Right Ear BID  . enoxaparin (LOVENOX) injection  30 mg Subcutaneous Q24H  . feeding supplement (GLUCERNA SHAKE)  237 mL Oral BID BM  . insulin aspart  0-15 Units Subcutaneous TID WC  . insulin aspart  0-5 Units Subcutaneous QHS  . insulin glargine  12 Units Subcutaneous Daily  . irbesartan  300 mg Oral Daily  . metoprolol tartrate  25 mg Oral BID   Continuous Infusions:    Principal Problem:   Sepsis Active Problems:   Weakness   SIRS (systemic inflammatory response syndrome)   Leukocytosis   Altered mental status   Acute renal failure  Elevated troponin   Elevated d-dimer   Otitis media   Essential hypertension, benign   Type II or unspecified type diabetes mellitus without mention of complication, not stated as uncontrolled   Anemia   Acute myocardial infarction, subendocardial infarction, initial episode of care    Time spent: 35 min    Jeralyn Bennett  [facility] Pager [phone]. If 7PM-7AM, please contact night-coverage at [email or web address], password [facility] 05/05/2013, 6:41 PM  LOS: 3 days

## 2013-05-05 NOTE — Progress Notes (Signed)
INITIAL NUTRITION ASSESSMENT  DOCUMENTATION CODES Per approved criteria  -Not Applicable   INTERVENTION: Glucerna Shake po BID, each supplement provides 220 kcal and 10 grams of protein RD to follow for nutrition care plan  NUTRITION DIAGNOSIS: Inadequate oral intake related to poor appetite as evidenced by PO intake 0-10%  Goal: Pt to meet >/= 90% of their estimated nutrition needs   Monitor:  PO & supplemental intake, weight, labs, I/O's  Reason for Assessment: Consult, Low Braden  [age] y.o. female  Admitting Dx: Sepsis  ASSESSMENT: 80 y.o. female with a history of HTN and DM2 [facility] due to progressive weakness and poor intake of foods and liquids over the past 3 days. She also had been having increased complaints of left hip pain.  RD unable to obtain nutrition hx; pt sleeping upon RD visit with blanket over self; unable to wake; noted patient lives with daughter; per wt readings, pt has had a 3% weight loss since November 2014 -- not significant for time frame; PO intake very poor at 0-10% per flowsheet records; would benefit from addition of oral nutrition supplements; RD to order.  Low braden score places patient at risk for skin breakdown.  RD unable to complete Nutrition Focused Physical Exam at this time.  Height: Ht Readings from Last 1 Encounters:  05/03/13 5' (1.524 m)    Weight: Wt Readings from Last 1 Encounters:  05/04/13 114 lb 10.2 oz (52 kg)    Ideal Body Weight: 100 lb  % Ideal Body Weight: 114%  Wt Readings from Last 10 Encounters:  05/04/13 114 lb 10.2 oz (52 kg)  12/05/12 118 lb (53.524 kg)  05/19/07 145 lb (65.772 kg)    Usual Body Weight: 118 lb  % Usual Body Weight: 97%  BMI:  Body mass index is 22.39 kg/(m^2).  Estimated Nutritional Needs: Kcal: 1200-1400 Protein: 55-65 gm Fluid: </= 1.5 L  Skin: Intact  Diet Order: Carb Control  EDUCATION NEEDS: -No education needs identified at this  time   Intake/Output Summary (Last 24 hours) at 05/05/13 1343 Last data filed at 05/05/13 1013  Gross per 24 hour  Intake    990 ml  Output    225 ml  Net    765 ml    Labs:   Recent Labs Lab 05/02/13 2350 05/04/13 0314 05/05/13 0225  NA 132* 134* 138  K 5.1 4.3 4.8  CL 90* 98 102  CO2 26 21 20   BUN 38* 26* 26*  CREATININE 1.70* 1.07 1.02  CALCIUM 10.0 9.1 8.7  GLUCOSE 301* 124* 243*    CBG (last 3)   Recent Labs  05/04/13 1214 05/04/13 1723 05/05/13 1152  GLUCAP 118* 218* 180*    Scheduled Meds: . aspirin  81 mg Oral Daily  . atorvastatin  20 mg Oral q1800  . cefUROXime  250 mg Oral BID WC  . ciprofloxacin-dexamethasone  4 drop Right Ear BID  . enoxaparin (LOVENOX) injection  30 mg Subcutaneous Q24H  . insulin aspart  0-15 Units Subcutaneous TID WC  . insulin aspart  0-5 Units Subcutaneous QHS  . insulin glargine  12 Units Subcutaneous Daily  . irbesartan  300 mg Oral Daily  . metoprolol tartrate  25 mg Oral BID    Continuous Infusions:   Past Medical History  Diagnosis Date  . Hypertension   . Diabetes mellitus   . Arthritis   . Renal insufficiency     Past Surgical History  Procedure  Laterality Date  . Back surgery      Maureen ChattersKatie Kamarri Fischetti, RD, LDN Pager #: [phone] After-Hours Pager #: [phone]

## 2013-05-06 LAB — GLUCOSE, CAPILLARY
GLUCOSE-CAPILLARY: 104 mg/dL — AB (ref 70–99)
GLUCOSE-CAPILLARY: 138 mg/dL — AB (ref 70–99)
GLUCOSE-CAPILLARY: 161 mg/dL — AB (ref 70–99)
GLUCOSE-CAPILLARY: 175 mg/dL — AB (ref 70–99)
GLUCOSE-CAPILLARY: 69 mg/dL — AB (ref 70–99)
GLUCOSE-CAPILLARY: 81 mg/dL (ref 70–99)
Glucose-Capillary: 79 mg/dL (ref 70–99)

## 2013-05-06 MED ORDER — GLIPIZIDE 10 MG PO TABS
10.0000 mg | ORAL_TABLET | Freq: Every day | ORAL | Status: DC
  Filled 2013-05-06: qty 1

## 2013-05-06 MED ORDER — INSULIN GLARGINE 100 UNIT/ML ~~LOC~~ SOLN: 8.0000 [IU] | Freq: Every day | SUBCUTANEOUS | Status: DC

## 2013-05-06 MED ORDER — HYDROMORPHONE HCL PF 1 MG/ML IJ SOLN: 0.5000 mg | INTRAMUSCULAR | Status: DC | PRN

## 2013-05-06 NOTE — Progress Notes (Signed)
CBG was 68 this AM , juice as given CBG now is 81.

## 2013-05-06 NOTE — Progress Notes (Signed)
Physical Therapy Treatment and Discharge Patient Details Name: Kathleen Weeks MRN: [id number] DOB: Jun 20, 1922 Today's Date: 05/06/2013    History of Present Illness Kathleen Weeks is a 80 y.o. female with a history of HTN and DM2 who was taken to the [facility] ED due to progressive weakness and poor intake of foods and liquids over the past 3 days. 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 She has progressed well and is near/at her baseline per her daughter. No further PT needs identified and daughter agrees. Discharge from PT.  Follow Up Recommendations  No PT follow up;Supervision/Assistance - 24 hour     Equipment Recommendations  None recommended by PT    Recommendations for Other Services       Precautions / Restrictions Precautions Precautions: Fall Precaution Comments: nearly deaf; family communicates via writing    Mobility  Bed Mobility Overal bed mobility: Modified Independent                Transfers Overall transfer level: Needs assistance Equipment used: Rolling walker (2 wheeled) Transfers: Sit to/from Stand Sit to Stand: Min guard         General transfer comment: pt uses rollator at home and is able to lock it and pull on handles to come to stand; PT anchors RW to simulate home   Ambulation/Gait Ambulation/Gait assistance: Min guard Ambulation Distance (Feet): 60 Feet (10, 10, 20, 20 ) Assistive device: Rolling walker (2 wheeled) Gait Pattern/deviations: Step-through pattern;Decreased stride length;Trunk flexed     General Gait Details: pushes RW too far ahead (daughter present and reports this is baseline for pt); did not run into obstacles, 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)       Balance                                     Cognition Arousal/Alertness: Awake/alert Behavior During Therapy: Flat affect Overall Cognitive Status: Difficult to assess                      Exercises General Exercises - Lower Extremity Heel Slides: AROM;Strengthening;Both;10 reps;Supine (resisted extension)    General Comments General comments (skin integrity, edema, etc.): Daugher present throughout, denies any further PT or DME needs; agrees with discharge from PT      Pertinent Vitals/Pain no apparent distress     Home Living                      Prior Function            PT Goals (current goals can now be found in the care plan section) Progress towards PT goals: Goals met/education completed, patient discharged from PT (stair goal deferred)    Frequency       PT Plan Current plan remains appropriate    Co-evaluation             End of Session Equipment Utilized During Treatment: Gait belt Activity Tolerance: Patient tolerated treatment well Patient left: in chair;with call bell/phone within reach;with family/visitor present   Patient is being discharged from PT  services secondary to:  Goals met and no further therapy needs identified.   Please see latest Therapy Progress Note for current level of functioning and progress toward goals.  Progress and discharge plan and discussed with patient/caregiver and they  Agree      Time: 1206-1248 PT Time Calculation (min): 42 min  Charges:  $Gait Training: 23-37 mins $Therapeutic Exercise: 8-22 mins                    G Codes:      Jeanie Cooks Anthonia Monger 2013-05-26, 1:07 PM Pager [phone]

## 2013-05-06 NOTE — Progress Notes (Signed)
[facility]  CHARM STENNER FFM:[id number] DOB: [birth year] DOA: 05/02/2013 PCP: Maximino Greenland, MD  Time spent : 35 minutes   Interim Discharge Summary  80 year old female currently residing in the community admitted to the medicine service on 05/03/2013 with failure to thrive, functional decline, poor oral intake, decreased responsiveness and overall generalized weakness. Patient was thought to be septic, supported by her initial lab work showing a white count of 26,900, and lactic acid of 2.59. Family members reported discharge from her right ear. CT scan of brain did not reveal acute intracranial abnormalities, however she was found to have mucosal thickening throughout the paranasal sinuses with opacification of right mastoid air cells. It was felt that the source of infection could be mastoiditis. She was started on empiric IV antibiotic therapy with vancomycin and cefepime. Patient was administered IV fluids. With regard to her elevated troponin, it was felt this represented demand ischemia in setting of sepsis - Cardiology was consulted. Patient showed gradual clinical improvement, as labs showed resolution of acute kidney injury. By 05/05/2013 her creatinine had come down to 1.02 with BUN of 26. Her white count had also normalized by 05/05/2013 to 9,400. She remained afebrile for over 24 hours. On this date IV antimicrobial therapy was transitioned to Ceftin. It was noted that she continued have drainage from her right ear. The attending M.D. spoke with Dr Kasandra Knudsen Raynelle Bring of the ENT. He reviewed the CT scan of brain performed on 05/03/2013 and did not feel she required immediate surgical intervention. He recommended that patient see them at the office on the day of discharge so that she may undergo drainage of external auditory canal. The attending M.D. spoke with family members on 05/05/2013, updated on patient's condition and the need to follow up with ENT  on day of discharge. Given patient's improvement, transfer orders were written for MedSurg.   Plan: Please have patient see Dr Kasandra Knudsen Raynelle Bring on discharge   HPI/Subjective: Sleeping- still endorses gen malaise  Assessment/Plan:  Sepsis (present on admission) -Met sepsis criteria: white count of 26,900, lactate of 2.59, acute kidney injury, encephalopathy, hypotension, and tachycardia with fever -Chest x-ray showed no acute cardiopulmonary disease, urinalysis unremarkable  -Likely source of infection otitis media/mastioditis  -Labs improving  -Transitioned to Ceftin 250 mg PO BID 4/14   NSTEMI -Patient initially presented with a troponin 1.26, coming down to 0.67  -suspect secondary to demand ischemia in setting of sepsis  -preserved LV fnx and no RWMA on echo -Cardiology consulted    HTN/Grade 1 Diastolic dysfunction -cont home ARB -started on BB this admit -home thiazide diuretic on hold and given adv age consider not resuming  Acute kidney injury  -Labs showing an improvement to kidney function as Creatinine trending down to 1.07 from 1.7 as of 4/14 -Likely secondary to sepsis/hypovolemia  -Stopped IV fluids, encourage oral intake 4/14  Acute encephalopathy  -Likely secondary to sepsis and ?? hypoglycemia -Family members reporting a steep functional decline over the past 2-3 days prior to admission  -Today is groggy but recent hypoglycemia  Elevated d-dimer  -d-dimer was 0.7 but this is very nonspecific and likely related to sepsis itself -attempted to perform VQ scan which was unsuccessful given lack of patient cooperation  -Patient having O2 sats in the upper 90s on room air  -With lack of chest pain or other clinical symptoms to suggest pulmonary embolism further evaluation is not felt to be indicated  Type 2 diabetes mellitus  -hyperglycemia improving after insulin glargine 12 units subcutaneous daily started on 05/05/2013 but today sx hypoglycemia (69) so Lantus  dc'd -was on glucotrol alone at home but given low CBGs will just use SSI alone for now and follow CBGs   DVT prophylaxis: Lovenox Code Status: Full Family Communication: Daughter at bedside Disposition Plan/Expected LOS: Transfer to floor -continue PT - anticipate discharge in next 24-48 hours  Consultants: None  Procedures: 2-D echocardiogram - Technically difficult. Poor patient compliance. Patient removed probe in technicians hand from her chest. Patient sitting up and on right side and back to sitting up throughout echo. Unable to obtain remainder of study after apical views. - Left ventricle: The cavity size was normal. There was severe concentric hypertrophy. Systolic function was vigorous. The estimated ejection fraction was in the range of 65% to 70%. Images were inadequate for LV wall motion assessment. Doppler parameters are consistent with abnormal left ventricular relaxation (grade 1 diastolic dysfunction). - Left atrium: The atrium was normal in size. - Tricuspid valve: Moderate regurgitation. - Pulmonary arteries: Poorly visualized. The main pulmonary artery was normal-sized. PA peak pressure: 64m Hg (S).  Antibiotics: Ceftin 4/14 >> Ampicillin 4/11 Maxipime 4/12-4/13 Vancomycin 4/12-4/13  Objective: Blood pressure 128/59, pulse 71, temperature 98 F (36.7 C), temperature source Oral, resp. rate 21, height 5' (1.524 m), weight 114 lb 10.2 oz (52 kg), SpO2 96.00%.  Intake/Output Summary (Last 24 hours) at 05/06/13 1142 Last data filed at 05/06/13 09509 Gross per 24 hour  Intake    480 ml  Output    100 ml  Net    380 ml   Exam: General: No acute respiratory distress Lungs: Clear to auscultation bilaterally without wheezes or crackles, RA-96% Cardiovascular: Regular rate and rhythm without murmur gallop or rub normal S1 and S2, no peripheral edema or JVD Abdomen: Nontender, nondistended, soft, bowel sounds positive, no rebound, no ascites, no  appreciable mass Musculoskeletal: No significant cyanosis, clubbing of bilateral lower extremities Neurological: Somnolent/lethargic, moves all extremities x 4 without focal neurological deficits  Scheduled Meds:  Scheduled Meds: . aspirin  81 mg Oral Daily  . atorvastatin  20 mg Oral q1800  . cefUROXime  250 mg Oral BID WC  . ciprofloxacin-dexamethasone  4 drop Right Ear BID  . enoxaparin (LOVENOX) injection  30 mg Subcutaneous Q24H  . feeding supplement (GLUCERNA SHAKE)  237 mL Oral BID BM  . insulin aspart  0-15 Units Subcutaneous TID WC  . insulin aspart  0-5 Units Subcutaneous QHS  . irbesartan  300 mg Oral Daily  . metoprolol tartrate  25 mg Oral BID   Data Reviewed: Basic Metabolic Panel:  Recent Labs Lab 05/02/13 2350 05/04/13 0314 05/05/13 0225  NA 132* 134* 138  K 5.1 4.3 4.8  CL 90* 98 102  CO2 '26 21 20  ' GLUCOSE 301* 124* 243*  BUN 38* 26* 26*  CREATININE 1.70* 1.07 1.02  CALCIUM 10.0 9.1 8.7   Liver Function Tests: No results found for this basename: AST, ALT, ALKPHOS, BILITOT, PROT, ALBUMIN,  in the last 168 hours  CBC:  Recent Labs Lab 05/02/13 2350 05/04/13 0314 05/05/13 0225  WBC 26.9* 14.6* 9.4  NEUTROABS 23.9*  --   --   HGB 9.3* 8.5* 7.9*  HCT 27.4* 25.2* 23.5*  MCV 95.8 93.7 94.4  PLT 550* 458* 427*   Cardiac Enzymes:  Recent Labs Lab 05/03/13 0034 05/03/13 1320 05/03/13 1930  TROPONINI 1.26* 0.67* 0.44*   BNP (  last 3 results)  Recent Labs  11/07/12 1523  PROBNP 395.9   CBG:  Recent Labs Lab 05/05/13 1954 05/06/13 0014 05/06/13 0401 05/06/13 0748 05/06/13 0826  GLUCAP 116* 161* 104* 69* 81    Recent Results (from the past 240 hour(s))  CULTURE, BLOOD (ROUTINE X 2)     Status: None   Collection Time    05/03/13 12:40 AM      Result Value Ref Range Status   Specimen Description BLOOD LEFT ARM   Final   Special Requests BOTTLES DRAWN AEROBIC AND ANAEROBIC 5ML EACH   Final   Culture  Setup Time     Final   Value:  05/03/2013 01:30     Performed at [facility]   Culture     Final   Value:        BLOOD CULTURE RECEIVED NO GROWTH TO DATE CULTURE WILL BE HELD FOR 5 DAYS BEFORE ISSUING A FINAL NEGATIVE REPORT     Performed at [facility]   Report Status PENDING   Incomplete  CULTURE, BLOOD (ROUTINE X 2)     Status: None   Collection Time    05/03/13 12:45 AM      Result Value Ref Range Status   Specimen Description BLOOD RIGHT ARM   Final   Special Requests BOTTLES DRAWN AEROBIC AND ANAEROBIC 5ML EACH   Final   Culture  Setup Time     Final   Value: 05/03/2013 01:30     Performed at [facility]   Culture     Final   Value:        BLOOD CULTURE RECEIVED NO GROWTH TO DATE CULTURE WILL BE HELD FOR 5 DAYS BEFORE ISSUING A FINAL NEGATIVE REPORT     Performed at [facility]   Report Status PENDING   Incomplete  MRSA PCR SCREENING     Status: None   Collection Time    05/03/13  4:06 AM      Result Value Ref Range Status   MRSA by PCR NEGATIVE  NEGATIVE Final   Comment:            The GeneXpert MRSA Assay (FDA     approved for NASAL specimens     only), is one component of a     comprehensive MRSA colonization     surveillance program. It is not     intended to diagnose MRSA     infection nor to guide or     monitor treatment for     MRSA infections.     Studies:  Recent x-ray studies have been reviewed in detail by the Attending Physician       Erin Hearing, ANP [facility] Office  [phone] Pager [phone]  **If unable to reach the above provider after paging please contact the [facility] @ [phone]  On-Call/Text Page:      [email or web address]      password [email or web address]  If 7PM-7AM, please contact night-coverage [email or web address] Password [email or web address] 05/06/2013, 11:42 AM   LOS: 4 days   I have personally examined this patient and reviewed the entire database. I have reviewed the above note, made any necessary editorial changes, and agree with its  content.  Cherene Altes, MD [facility]

## 2013-05-07 DIAGNOSIS — D649 Anemia, unspecified: Secondary | ICD-10-CM

## 2013-05-07 DIAGNOSIS — H6690 Otitis media, unspecified, unspecified ear: Secondary | ICD-10-CM

## 2013-05-07 DIAGNOSIS — I248 Other forms of acute ischemic heart disease: Secondary | ICD-10-CM

## 2013-05-07 LAB — GLUCOSE, CAPILLARY
GLUCOSE-CAPILLARY: 103 mg/dL — AB (ref 70–99)
Glucose-Capillary: 75 mg/dL (ref 70–99)
Glucose-Capillary: 80 mg/dL (ref 70–99)

## 2013-05-07 LAB — COMPREHENSIVE METABOLIC PANEL
ALBUMIN: 2.2 g/dL — AB (ref 3.5–5.2)
ALK PHOS: 64 U/L (ref 39–117)
ALT: 7 U/L (ref 0–35)
AST: 11 U/L (ref 0–37)
BILIRUBIN TOTAL: 0.3 mg/dL (ref 0.3–1.2)
BUN: 17 mg/dL (ref 6–23)
CHLORIDE: 103 meq/L (ref 96–112)
CO2: 22 meq/L (ref 19–32)
Calcium: 8.7 mg/dL (ref 8.4–10.5)
Creatinine, Ser: 0.97 mg/dL (ref 0.50–1.10)
GFR calc Af Amer: 59 mL/min — ABNORMAL LOW (ref >90)
GFR, EST NON AFRICAN AMERICAN: 51 mL/min — AB (ref >90)
Glucose, Bld: 85 mg/dL (ref 70–99)
POTASSIUM: 4.4 meq/L (ref 3.7–5.3)
SODIUM: 139 meq/L (ref 137–147)
Total Protein: 5.9 g/dL — ABNORMAL LOW (ref 6.0–8.3)

## 2013-05-07 LAB — CBC
HCT: 23.9 % — ABNORMAL LOW (ref 36.0–46.0)
HEMOGLOBIN: 7.7 g/dL — AB (ref 12.0–15.0)
MCH: 30.9 pg (ref 26.0–34.0)
MCHC: 32.2 g/dL (ref 30.0–36.0)
MCV: 96 fL (ref 78.0–100.0)
PLATELETS: 396 10*3/uL (ref 150–400)
RBC: 2.49 MIL/uL — AB (ref 3.87–5.11)
RDW: 13.9 % (ref 11.5–15.5)
WBC: 8.1 10*3/uL (ref 4.0–10.5)

## 2013-05-07 MED ORDER — ASPIRIN 81 MG PO CHEW: 81.0000 mg | CHEWABLE_TABLET | Freq: Every day | ORAL | Status: AC

## 2013-05-07 MED ORDER — PNEUMOCOCCAL VAC POLYVALENT 25 MCG/0.5ML IJ INJ: 0.5000 mL | INJECTION | INTRAMUSCULAR | Status: DC

## 2013-05-07 MED ORDER — ENSURE PO LIQD: 237.0000 mL | Freq: Three times a day (TID) | ORAL | Status: AC

## 2013-05-07 MED ORDER — OXYCODONE HCL 5 MG PO TABS: 5.0000 mg | ORAL_TABLET | ORAL | Status: AC | PRN

## 2013-05-07 MED ORDER — OLMESARTAN MEDOXOMIL 40 MG PO TABS: 40.0000 mg | ORAL_TABLET | Freq: Every day | ORAL | Status: AC

## 2013-05-07 MED ORDER — CEFUROXIME AXETIL 250 MG PO TABS: 250.0000 mg | ORAL_TABLET | Freq: Two times a day (BID) | ORAL | Status: AC

## 2013-05-07 MED ORDER — PNEUMOCOCCAL VAC POLYVALENT 25 MCG/0.5ML IJ INJ: 0.5000 mL | INJECTION | Freq: Once | INTRAMUSCULAR | Status: AC

## 2013-05-07 MED ORDER — PNEUMOCOCCAL VAC POLYVALENT 25 MCG/0.5ML IJ INJ
0.5000 mL | INJECTION | Freq: Once | INTRAMUSCULAR | Status: AC
  Administered 2013-05-07: 0.5 mL via INTRAMUSCULAR
  Filled 2013-05-07: qty 0.5

## 2013-05-07 MED ORDER — CIPROFLOXACIN-DEXAMETHASONE 0.3-0.1 % OT SUSP: 4.0000 [drp] | Freq: Two times a day (BID) | OTIC | Status: AC

## 2013-05-07 MED ORDER — METOPROLOL TARTRATE 25 MG PO TABS: 25.0000 mg | ORAL_TABLET | Freq: Two times a day (BID) | ORAL | Status: DC

## 2013-05-07 NOTE — Discharge Instructions (Signed)
Otalgia °The most common reason for this in children is an infection of the middle ear. Pain from the middle ear is usually caused by a build-up of fluid and pressure behind the eardrum. Pain from an earache can be sharp, dull, or burning. The pain may be temporary or constant. The middle ear is connected to the nasal passages by a short narrow tube called the Eustachian tube. The Eustachian tube allows fluid to drain out of the middle ear, and helps keep the pressure in your ear equalized. °CAUSES  °A cold or allergy can block the Eustachian tube with inflammation and the build-up of secretions. This is especially likely in small children, because their Eustachian tube is shorter and more horizontal. When the Eustachian tube closes, the normal flow of fluid from the middle ear is stopped. Fluid can accumulate and cause stuffiness, pain, hearing loss, and an ear infection if germs start growing in this area. °SYMPTOMS  °The symptoms of an ear infection may include fever, ear pain, fussiness, increased crying, and irritability. Many children will have temporary and minor hearing loss during and right after an ear infection. Permanent hearing loss is rare, but the risk increases the more infections a child has. Other causes of ear pain include retained water in the outer ear canal from swimming and bathing. °Ear pain in adults is less likely to be from an ear infection. Ear pain may be referred from other locations. Referred pain may be from the joint between your jaw and the skull. It may also come from a tooth problem or problems in the neck. Other causes of ear pain include: °· A foreign body in the ear. °· Outer ear infection. °· Sinus infections. °· Impacted ear wax. °· Ear injury. °· Arthritis of the jaw or TMJ problems. °· Middle ear infection. °· Tooth infections. °· Sore throat with pain to the ears. °DIAGNOSIS  °Your caregiver can usually make the diagnosis by examining you. Sometimes other special studies,  including x-rays and lab work may be necessary. °TREATMENT  °· If antibiotics were prescribed, use them as directed and finish them even if you or your child's symptoms seem to be improved. °· Sometimes PE tubes are needed in children. These are little plastic tubes which are put into the eardrum during a simple surgical procedure. They allow fluid to drain easier and allow the pressure in the middle ear to equalize. This helps relieve the ear pain caused by pressure changes. °HOME CARE INSTRUCTIONS  °· Only take over-the-counter or prescription medicines for pain, discomfort, or fever as directed by your caregiver. DO NOT GIVE CHILDREN ASPIRIN because of the association of Reye's Syndrome in children taking aspirin. °· Use a cold pack applied to the outer ear for 15-20 minutes, 03-04 times per day or as needed may reduce pain. Do not apply ice directly to the skin. You may cause frost bite. °· Over-the-counter ear drops used as directed may be effective. Your caregiver may sometimes prescribe ear drops. °· Resting in an upright position may help reduce pressure in the middle ear and relieve pain. °· Ear pain caused by rapidly descending from high altitudes can be relieved by swallowing or chewing gum. Allowing infants to suck on a bottle during airplane travel can help. °· Do not smoke in the house or near children. If you are unable to quit smoking, smoke outside. °· Control allergies. °SEEK IMMEDIATE MEDICAL CARE IF:  °· You or your child are becoming sicker. °· Pain or fever   relief is not obtained with medicine.  You or your child's symptoms (pain, fever, or irritability) do not improve within 24 to 48 hours or as instructed.  Severe pain suddenly stops hurting. This may indicate a ruptured eardrum.  You or your children develop new problems such as severe headaches, stiff neck, difficulty swallowing, or swelling of the face or around the ear. Document Released: 08/26/2003 Document Revised: 04/02/2011  Document Reviewed: 12/31/2007 [facility] Patient Information 2014 [place], [place]. Hypoglycemia (Low Blood Sugar) Hypoglycemia is when the glucose (sugar) in your blood is too low. Hypoglycemia can happen for many reasons. It can happen to people with or without diabetes. Hypoglycemia can develop quickly and can be a medical emergency.  CAUSES  Having hypoglycemia does not mean that you will develop diabetes. Different causes include:  Missed or delayed meals or not enough carbohydrates eaten.  Medication overdose. This could be by accident or deliberate. If by accident, your medication may need to be adjusted or changed.  Exercise or increased activity without adjustments in carbohydrates or medications.  A nerve disorder that affects body functions like your heart rate, blood pressure and digestion (autonomic neuropathy).  A condition where the stomach muscles do not function properly (gastroparesis). Therefore, medications may not absorb properly.  The inability to recognize the signs of hypoglycemia (hypoglycemic unawareness).  Absorption of insulin  may be altered.  Alcohol consumption.  Pregnancy/menstrual cycles/postpartum. This may be due to hormones.  Certain kinds of tumors. This is very rare. SYMPTOMS   Sweating.  Hunger.  Dizziness.  Blurred vision.  Drowsiness.  Weakness.  Headache.  Rapid heart beat.  Shakiness.  Nervousness. DIAGNOSIS  Diagnosis is made by monitoring blood glucose in one or all of the following ways:  Fingerstick blood glucose monitoring.  Laboratory results. TREATMENT  If you think your blood glucose is low:  Check your blood glucose, if possible. If it is less than 70 mg/dl, take one of the following:  3-4 glucose tablets.   cup juice (prefer clear like apple).   cup "regular" soda pop.  1 cup milk.  -1 tube of glucose gel.  5-6 hard candies.  Do not over treat because your blood glucose (sugar) will only go too  high.  Wait 15 minutes and recheck your blood glucose. If it is still less than 70 mg/dl (or below your target range), repeat treatment.  Eat a snack if it is more than one hour until your next meal. Sometimes, your blood glucose may go so low that you are unable to treat yourself. You may need someone to help you. You may even pass out or be unable to swallow. This may require you to get an injection of glucagon, which raises the blood glucose. HOME CARE INSTRUCTIONS  Check blood glucose as recommended by your caregiver.  Take medication as prescribed by your caregiver.  Follow your meal plan. Do not skip meals. Eat on time.  If you are going to drink alcohol, drink it only with meals.  Check your blood glucose before driving.  Check your blood glucose before and after exercise. If you exercise longer or different than usual, be sure to check blood glucose more frequently.  Always carry treatment with you. Glucose tablets are the easiest to carry.  Always wear medical alert jewelry or carry some form of identification that states that you have diabetes. This will alert people that you have diabetes. If you have hypoglycemia, they will have a better idea on what to do. SEEK  MEDICAL CARE IF:   You are having problems keeping your blood sugar at target range.  You are having frequent episodes of hypoglycemia.  You feel you might be having side effects from your medicines.  You have symptoms of an illness that is not improving after 3-4 days.  You notice a change in vision or a new problem with your vision. SEEK IMMEDIATE MEDICAL CARE IF:   You are a family member or friend of a person whose blood glucose goes below 70 mg/dl and is accompanied by:  Confusion.  A change in mental status.  The inability to swallow.  Passing out. Document Released: 01/08/2005 Document Revised: 04/02/2011 Document Reviewed: 05/07/2011 [facility] Patient Information 2014 [employer],  [place]. Mastoiditis Mastoiditis is an infection that has spread from the middle ear to a bony area (the mastoid air cells) behind the middle ear. It is an uncommon complication of a middle ear infection. It occurs most often in young children. Treatment with the right antibiotics (medications that are used to treat bacteria germs) is generally effective. With the right treatment, there is a very high chance of full recovery.  SYMPTOMS  Some common symptoms of mastoiditis include:   Pain, swelling, redness, warmth, or a tender mass of the bone behind the ear.  Fever.  Fussiness and irritability.  Redness and swelling of the ear lobe or ear.  Ear drainage.  Headache. DIAGNOSIS  Your caregiver will make the diagnosis based on an exam and on questions about what you or your child has been feeling. Other tests that may be done include:  Blood work.  Blood cultures or cultures of ear drainage.  X-rays.  If you or your child has symptoms that suggest more serious problems, additional tests and/or specialized x-rays may be done. These could include:  CT (CAT) scans.  Magnetic Resonance Imaging (MRI). TREATMENT  Treatment will be based on how serious the infection is and what will be expected to give the best outcome. The treatment required may be:  Hospitalization and antibiotics given through a vein.  An operation (myringotomy) is sometimes done to relieve the pressure from the middle ear. This is a surgical procedure where a small hole is cut into the ear drum. A small tube is then placed to keep the hole open and draining. The tubes usually fall out on their own after 6 to 12 months.  In rare cases, if the above treatments do not work, more surgery may be necessary. This is called a mastoidectomy. RISKS AND COMPLICATIONS These are rare if proper treatment is started early. These can include:  Facial paralysis  Infection and possible destruction of the mastoid bone.  Spread of  infection to the neck.  Hearing loss which can be partial or complete on the side of the infection.  Infection spread to the brain.  Clots or blockage of blood vessels in the neck or brain. HOME CARE INSTRUCTIONS   Take prescribed antibiotics and other medications as directed by your caregiver.  Follow-up with an exam by an ENT (Ear, Nose, and Throat) specialist if recommended.  A follow-up hearing test (audiogram) may be recommended. SEEK MEDICAL CARE IF:   You develop recurrent fevers of 100 F (37.8 C) or higher.  You develop new headache, ear, or facial pain that had not happened before.  You feel that there has been loss of hearing. SEEK IMMEDIATE MEDICAL CARE IF:   You develop fevers of 102 F (38.9 C).  You develop severe headache, ear, or  facial pain.  You experience sudden hearing loss.  You develop repeated episodes of vomiting.  You develop weakness or drooping of one side of your face.  You experience weakness of one arm, one leg, or on one side of your body.  You develop sudden problems with speech and/or vision. MAKE SURE YOU:   Understand these instructions.  Will watch your condition.  Will get help right away if you are not doing well or get worse. Document Released: 02/07/2006 Document Revised: 04/02/2011 Document Reviewed: 12/27/2006 [facility] Patient Information 2014 [place], [place].

## 2013-05-07 NOTE — Discharge Summary (Signed)
Physician Discharge Summary  Ensley Blas Norman TMH:[id number] DOB: 1922/10/24 DOA: 05/02/2013  PCP: Maximino Greenland, MD  Admit date: 05/02/2013 Discharge date: 05/07/2013  Time spent: 30 minutes  Recommendations for Outpatient Follow-up:  1. Follow up with Dr. Benjamine Mola immediately after dc to have fluid drained form the ear canal 2. Follow up with your PCP 1-2 weeks after discharge 3. Hold Glucotrol until CBGs 200 or more 4. Supplement diet with OTC Ensure- if develops high blood sugars change to Glucerna  Discharge Diagnoses:  Principal Problem:   Sepsis-resolved Active Problems:   Altered mental status from sepsis   Acute renal failure-resolved   Elevated troponin/Elevated d-dimer due to sepsis and dehydration   Otitis media with mastoiditis   Essential hypertension, benign   Type II or unspecified type diabetes mellitus with hypoglycemia   Anemia  NSTEMI due to demand ischemia   Discharge Condition: stable  Diet recommendation: Carbohydrate modified with protein shake supplements OTC  Filed Weights   05/03/13 0459 05/04/13 0408 05/06/13 0424  Weight: 107 lb 12.9 oz (48.9 kg) 114 lb 10.2 oz (52 kg) 114 lb 10.2 oz (52 kg)    History of present illness:  80 year old female currently residing in the community admitted to the medicine service on 05/03/2013 with failure to thrive, functional decline, poor oral intake, decreased responsiveness and overall generalized weakness. Patient was thought to be septic, supported by her initial lab work showing a white count of 26,900, and lactic acid of 2.59. Family members reported discharge from her right ear. CT scan of brain did not reveal acute intracranial abnormalities, however she was found to have mucosal thickening throughout the paranasal sinuses with opacification of right mastoid air cells. It was felt that the source of infection could be mastoiditis. She was started on empiric IV antibiotic therapy with vancomycin and cefepime.  Patient was administered IV fluids. With regard to her elevated troponin, it was felt this represented demand ischemia in setting of sepsis - Cardiology was consulted. Patient showed gradual clinical improvement, as labs showed resolution of acute kidney injury. By 05/05/2013 her creatinine had come down to 1.02 with BUN of 26. Her white count had also normalized by 05/05/2013 to 9,400. She remained afebrile for over 24 hours. On this date IV antimicrobial therapy was transitioned to Ceftin. It was noted that she continued have drainage from her right ear. The attending M.D. spoke with Dr Kasandra Knudsen Raynelle Bring of the ENT. He reviewed the CT scan of brain performed on 05/03/2013 and did not feel she required immediate surgical intervention. He recommended that patient see them at the office on the day of discharge so that she may undergo drainage of external auditory canal. The attending M.D. spoke with family members on 05/05/2013, updated on patient's condition and the need to follow up with ENT on day of discharge. Given patient's improvement, transfer orders were written for MedSurg.    Hospital Course:  Sepsis (present on admission)  -Met sepsis criteria: white count of 26,900, lactate of 2.59, acute kidney injury, encephalopathy, hypotension, and tachycardia with fever  -Chest x-ray showed no acute cardiopulmonary disease, urinalysis unremarkable  -Likely source of infection otitis media/mastioditis  -Transitioned to Ceftin 250 mg PO BID 4/14 -continue for total of 10 days after dc  NSTEMI  -Patient initially presented with a troponin 1.26, coming down to 0.67  -suspect secondary to demand ischemia in setting of sepsis  -preserved LV fnx and no RWMA on echo  -Cardiology consulted and did not recommend  additional ischemic evaluation  HTN/Grade 1 Diastolic dysfunction  -cont home ARB  -started on BB this admit  -home thiazide diuretic on hold and given adv age opted not to resume upon dc   Acute  kidney injury  -Labs showed an improvement to kidney function as Creatinine trending down to 1.07 from 1.7 as of 4/14  -Likely secondary to sepsis/hypovolemia  -Stopped IV fluids, encourage oral intake 4/14  -consider repeat BMET after dc  Acute encephalopathy  -Likely secondary to sepsis and ?? hypoglycemia  -Family members reporting a steep functional decline over the past 2-3 days prior to admission  -much more alert today on date of dc  Elevated d-dimer  -d-dimer was 0.7 but this is very nonspecific and likely related to sepsis itself  -attempted to perform VQ scan which was unsuccessful given lack of patient cooperation  -Patient having O2 sats in the upper 90s on room air  -With lack of chest pain or other clinical symptoms to suggest pulmonary embolism further evaluation is not felt to be indicated   Type 2 diabetes mellitus  -hyperglycemia improving after insulin glargine 12 units subcutaneous daily started on 05/05/2013 but today sx hypoglycemia (69) so Lantus dc'd  -have determined to stop Glucotrol at dc and only resume if CBG > 200- family instructed to check CBGs AC/HS   Procedures: 2-D echocardiogram  - Technically difficult. Poor patient compliance. Patient removed probe in technicians hand from her chest. Patient sitting up and on right side and back to sitting up throughout echo. Unable to obtain remainder of study after apical views. - Left ventricle: The cavity size was normal. There was severe concentric hypertrophy. Systolic function was vigorous. The estimated ejection fraction was in the range of 65% to 70%. Images were inadequate for LV wall motion assessment. Doppler parameters are consistent with abnormal left ventricular relaxation (grade 1 diastolic dysfunction). - Left atrium: The atrium was normal in size. - Tricuspid valve: Moderate regurgitation. - Pulmonary arteries: Poorly visualized. The main pulmonary artery was normal-sized. PA peak  pressure: 77m Hg (S).   Consultations:  Cardiology  Discharge Exam: Filed Vitals:   05/07/13 1003  BP: 152/66  Pulse: 71  Temp: 98.3 F (36.8 C)  Resp: 16   General: No acute respiratory distress  Lungs: Clear to auscultation bilaterally without wheezes or crackles, RA-96%  Cardiovascular: Regular rate and rhythm without murmur gallop or rub normal S1 and S2, no peripheral edema or JVD  Abdomen: Nontender, nondistended, soft, bowel sounds positive, no rebound, no ascites, no appreciable mass  Musculoskeletal: No significant cyanosis, clubbing of bilateral lower extremities  Neurological: Awake and sitting up in chair, moves all extremities x 4 without focal neurological deficits-baseline HOH which has worsened in setting of recent otitis    Discharge Instructions You were cared for by a hospitalist during your hospital stay. If you have any questions about your discharge medications or the care you received while you were in the hospital after you are discharged, you can call the unit and asked to speak with the hospitalist on call if the hospitalist that took care of you is not available. Once you are discharged, your primary care physician will handle any further medical issues. Please note that NO REFILLS for any discharge medications will be authorized once you are discharged, as it is imperative that you return to your primary care physician (or establish a relationship with a primary care physician if you do not have one) for your aftercare needs so that  they can reassess your need for medications and monitor your lab values.      Discharge Orders   Future Appointments Provider Department Dept Phone   05/18/2013 12:15 PM Imogene Burn, PA-C [place] Office [phone]   Future Orders Complete By Expires   Call MD for:  difficulty breathing, headache or visual disturbances  As directed    Call MD for:  extreme fatigue  As directed    Call MD for:   persistant dizziness or light-headedness  As directed    Call MD for:  persistant nausea and vomiting  As directed    Call MD for:  redness, tenderness, or signs of infection (pain, swelling, redness, odor or green/yellow discharge around incision site)  As directed    Call MD for:  severe uncontrolled pain  As directed    Call MD for:  temperature >100.4  As directed    Diet Carb Modified  As directed    Discharge instructions  As directed    Increase activity slowly  As directed        Medication List    STOP taking these medications       benzonatate 100 MG capsule  Commonly known as:  TESSALON     ciprofloxacin 500 MG tablet  Commonly known as:  CIPRO     glipiZIDE 5 MG tablet  Commonly known as:  GLUCOTROL     Hydrocodone-Acetaminophen 5-300 MG Tabs     neomycin-polymyxin-hydrocortisone 3.5-10000-1 otic suspension  Commonly known as:  CORTISPORIN     olmesartan-hydrochlorothiazide 40-25 MG per tablet  Commonly known as:  BENICAR HCT      TAKE these medications       aspirin 81 MG chewable tablet  Chew 1 tablet (81 mg total) by mouth daily.     cefUROXime 250 MG tablet  Commonly known as:  CEFTIN  Take 1 tablet (250 mg total) by mouth 2 (two) times daily with a meal.     ciprofloxacin-dexamethasone otic suspension  Commonly known as:  CIPRODEX  Place 4 drops into the right ear 2 (two) times daily.     ENSURE  Take 237 mLs by mouth 3 (three) times daily between meals.     metoprolol tartrate 25 MG tablet  Commonly known as:  LOPRESSOR  Take 1 tablet (25 mg total) by mouth 2 (two) times daily.     naproxen sodium 220 MG tablet  Commonly known as:  ANAPROX  Take 220 mg by mouth 2 (two) times daily with a meal.     olmesartan 40 MG tablet  Commonly known as:  BENICAR  Take 1 tablet (40 mg total) by mouth daily.     oxyCODONE 5 MG immediate release tablet  Commonly known as:  Oxy IR/ROXICODONE  Take 1 tablet (5 mg total) by mouth every 4 (four) hours as  needed for moderate pain.     pneumococcal 23 valent vaccine 25 MCG/0.5ML injection  Commonly known as:  PNU-IMMUNE  Inject 0.5 mLs into the muscle once. GIVEN AT DISCHARGE       No Known Allergies Follow-up Information   Follow up with Maximino Greenland, MD. (Call to be seen in 1-2 weeks after discharge)    Specialty:  Internal Medicine   Contact information:   [place] [address] [phone]       Follow up with Ascencion Dike, MD. (Call to be seen on date of discharge to have fluid drained from  ear canal)    Specialty:  Otolaryngology   Contact information:   [address] [place] [address] [phone]       Follow up with Ermalinda Barrios, PA-C On 05/18/2013. (See for Dr. Stanford Breed at 12:15 pm)    Specialty:  Cardiology   Contact information:   [place] [address] [phone]        The results of significant diagnostics from this hospitalization (including imaging, microbiology, ancillary and laboratory) are listed below for reference.    Significant Diagnostic Studies: Dg Chest 2 View  05/03/2013   CLINICAL DATA:  Hypertension and diabetes  EXAM: CHEST  2 VIEW  COMPARISON:  04/07/2013  FINDINGS: The heart size and mediastinal contours are within normal limits. No airspace consolidation identified. No pleural effusion or edema. Scoliosis deformity involves the thoracic spine. There is multi level degenerative disc disease noted. The visualized skeletal structures are unremarkable.  IMPRESSION: No active cardiopulmonary disease.   Electronically Signed   By: Kerby Moors M.D.   On: 05/03/2013 00:39   Dg Chest 2 View  04/07/2013   CLINICAL DATA:  COUGH SHOULDER PAIN  EXAM: CHEST  2 VIEW  COMPARISON:  DG CHEST 2 VIEW dated 02/03/2004  FINDINGS: The cardiac silhouette is mild-to-moderately enlarged. Aorta is tortuous. No focal regions of consolidation or focal infiltrates appreciated. Linear area of  increased density projects within the right upper lobe region. The density of this finding is appearance of region calcium possibly sequela of prior iatrogenic intervention. S-shaped scoliosis appreciated within the thoracic spine. No acute abnormalities identified.  IMPRESSION: No evidence of acute cardiopulmonary disease.   Electronically Signed   By: Margaree Mackintosh M.D.   On: 04/07/2013 13:09   Dg Pelvis 1-2 Views  05/03/2013   CLINICAL DATA:  Hip pain for 3 days  EXAM: PELVIS - 1-2 VIEW  COMPARISON:  None  FINDINGS: Bones are osteopenic diminishing sensitivity for nondisplaced fracture. Severe osteoarthritis with mild protrusio deformity involves the right hip. There is moderate osteoarthritis involving the left hip. No acute fracture or subluxation identified. The patient is status post previous posterior decompression and hardware fusion of the lumbar spine.  IMPRESSION: 1. No acute findings. 2. Moderate to severe osteoarthritis, right greater than left.   Electronically Signed   By: Kerby Moors M.D.   On: 05/03/2013 00:43   Ct Head Wo Contrast  05/03/2013   CLINICAL DATA:  Left hip pain. Decreased responsiveness and increased fatigue.  EXAM: CT HEAD WITHOUT CONTRAST  TECHNIQUE: Contiguous axial images were obtained from the base of the skull through the vertex without intravenous contrast.  COMPARISON:  11/07/2012  FINDINGS: Diffuse cerebral atrophy. Ventricular dilatation consistent with central atrophy. Low-attenuation change in the right posterior parietal and occipital region consistent with old infarct, present on the previous study. No mass effect or midline shift. No abnormal extra-axial fluid collections. No acute intracranial hemorrhage. There is mucosal thickening throughout the paranasal sinuses with opacification of the right mastoid air cells. Dense vascular calcifications.  IMPRESSION: No acute intracranial abnormalities. Chronic atrophy. 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  Mucosal thickening throughout the paranasal sinuses with opacification of the right mastoid air cells.   Electronically Signed   By: Lucienne Capers M.D.   On: 05/03/2013 00:45   Dg Chest Port 1 View  05/03/2013   CLINICAL DATA:  Sepsis.  EXAM: PORTABLE CHEST - 1 VIEW  COMPARISON:  DG CHEST 2 VIEW dated 05/03/2013; Darletta Moll  CHEST 2 VIEW dated 04/07/2013  FINDINGS: There is no evidence of pulmonary edema, consolidation, pneumothorax, nodule or pleural fluid. The heart size is normal. There is stable prominence of central pulmonary arteries. No bony abnormalities are seen.  IMPRESSION: No active disease.   Electronically Signed   By: Aletta Edouard M.D.   On: 05/03/2013 19:58    Microbiology: Recent Results (from the past 240 hour(s))  CULTURE, BLOOD (ROUTINE X 2)     Status: None   Collection Time    05/03/13 12:40 AM      Result Value Ref Range Status   Specimen Description BLOOD LEFT ARM   Final   Special Requests BOTTLES DRAWN AEROBIC AND ANAEROBIC 5ML EACH   Final   Culture  Setup Time     Final   Value: 05/03/2013 01:30     Performed at [facility]   Culture     Final   Value:        BLOOD CULTURE RECEIVED NO GROWTH TO DATE CULTURE WILL BE HELD FOR 5 DAYS BEFORE ISSUING A FINAL NEGATIVE REPORT     Performed at [facility]   Report Status PENDING   Incomplete  CULTURE, BLOOD (ROUTINE X 2)     Status: None   Collection Time    05/03/13 12:45 AM      Result Value Ref Range Status   Specimen Description BLOOD RIGHT ARM   Final   Special Requests BOTTLES DRAWN AEROBIC AND ANAEROBIC 5ML EACH   Final   Culture  Setup Time     Final   Value: 05/03/2013 01:30     Performed at [facility]   Culture     Final   Value:        BLOOD CULTURE RECEIVED NO GROWTH TO DATE CULTURE WILL BE HELD FOR 5 DAYS BEFORE ISSUING A FINAL NEGATIVE REPORT     Performed at [facility]   Report Status PENDING   Incomplete  MRSA PCR SCREENING     Status: None   Collection Time     05/03/13  4:06 AM      Result Value Ref Range Status   MRSA by PCR NEGATIVE  NEGATIVE Final   Comment:            The GeneXpert MRSA Assay (FDA     approved for NASAL specimens     only), is one component of a     comprehensive MRSA colonization     surveillance program. It is not     intended to diagnose MRSA     infection nor to guide or     monitor treatment for     MRSA infections.     Labs: Basic Metabolic Panel:  Recent Labs Lab 05/02/13 2350 05/04/13 0314 05/05/13 0225 05/07/13 0430  NA 132* 134* 138 139  K 5.1 4.3 4.8 4.4  CL 90* 98 102 103  CO2 '26 21 20 22  ' GLUCOSE 301* 124* 243* 85  BUN 38* 26* 26* 17  CREATININE 1.70* 1.07 1.02 0.97  CALCIUM 10.0 9.1 8.7 8.7   Liver Function Tests:  Recent Labs Lab 05/07/13 0430  AST 11  ALT 7  ALKPHOS 64  BILITOT 0.3  PROT 5.9*  ALBUMIN 2.2*   No results found for this basename: LIPASE, AMYLASE,  in the last 168 hours No results found for this basename: AMMONIA,  in the last 168 hours CBC:  Recent Labs Lab 05/02/13 2350 05/04/13  3903 05/05/13 0225 05/07/13 0430  WBC 26.9* 14.6* 9.4 8.1  NEUTROABS 23.9*  --   --   --   HGB 9.3* 8.5* 7.9* 7.7*  HCT 27.4* 25.2* 23.5* 23.9*  MCV 95.8 93.7 94.4 96.0  PLT 550* 458* 427* 396   Cardiac Enzymes:  Recent Labs Lab 05/03/13 0034 05/03/13 1320 05/03/13 1930  TROPONINI 1.26* 0.67* 0.44*   BNP: BNP (last 3 results)  Recent Labs  11/07/12 1523  PROBNP 395.9   CBG:  Recent Labs Lab 05/06/13 1238 05/06/13 1703 05/06/13 2350 05/07/13 0652 05/07/13 0731  GLUCAP 175* 138* 79 75 80       Signed:  Samella Parr ANP [facility] 05/07/2013, 11:00 AM  Examining patient and discuss assessment and plan with a and P. Ebony Hail and agree the patient is ready for discharge. Plan was discussed with patient and patient's family.

## 2013-05-07 NOTE — Progress Notes (Signed)
Patient discharged to home with instructions given to granddaughter at bedside.

## 2013-05-07 NOTE — Progress Notes (Addendum)
Patient Name: Kathleen Weeks Date of Encounter: 05/07/2013  Patient Profile: 80 yo female w/ hx HTN, DM, CKD admitted 04/11 w/ sepsis. Ez up w/ peak Trop 1.26. Lexi MV to be considered but timing unclear.  SUBJECTIVE: Granddaughter in room. Communicated by writing on the board. No chest pain, no SOB.   OBJECTIVE Filed Vitals:   05/07/13 0236 05/07/13 0631 05/07/13 0914 05/07/13 1003  BP: 152/68 161/74 154/67 152/66  Pulse: 71 65 70 71  Temp: 97.9 F (36.6 C) 98.1 F (36.7 C) 98 F (36.7 C) 98.3 F (36.8 C)  TempSrc:    Oral  Resp: 17 15 16 16   Height:      Weight:      SpO2: 99% 100% 100% 100%    Intake/Output Summary (Last 24 hours) at 05/07/13 1025 Last data filed at 05/06/13 1630  Gross per 24 hour  Intake    120 ml  Output      0 ml  Net    120 ml   Filed Weights   05/03/13 0459 05/04/13 0408 05/06/13 0424  Weight: 107 lb 12.9 oz (48.9 kg) 114 lb 10.2 oz (52 kg) 114 lb 10.2 oz (52 kg)    PHYSICAL EXAM General: Well developed, well nourished, female in no acute distress. Head: Normocephalic, atraumatic.  Neck: Supple without bruits, JVD not elevated. Lungs:  Resp regular and unlabored, few rales bases. Heart: RRR, S1, S2, no S3, S4, soft murmur; no rub. Abdomen: Soft, non-tender, non-distended, BS + x 4.  Extremities: No clubbing, cyanosis, no edema.  Neuro: Alert and oriented X 2. Moves all extremities spontaneously. Psych: Normal affect.  LABS: CBC: Recent Labs  05/05/13 0225 05/07/13 0430  WBC 9.4 8.1  HGB 7.9* 7.7*  HCT 23.5* 23.9*  MCV 94.4 96.0  PLT 427* 396   Basic Metabolic Panel: Recent Labs  05/05/13 0225 05/07/13 0430  NA 138 139  K 4.8 4.4  CL 102 103  CO2 20 22  GLUCOSE 243* 85  BUN 26* 17  CREATININE 1.02 0.97  CALCIUM 8.7 8.7   Liver Function Tests: Recent Labs  05/07/13 0430  AST 11  ALT 7  ALKPHOS 64  BILITOT 0.3  PROT 5.9*  ALBUMIN 2.2*   Cardiac Enzymes: Lab Results  Component Value Date   TROPONINI  0.44* 05/03/2013   TELE: Not on tele     Current Medications:  . aspirin  81 mg Oral Daily  . atorvastatin  20 mg Oral q1800  . cefUROXime  250 mg Oral BID WC  . ciprofloxacin-dexamethasone  4 drop Right Ear BID  . enoxaparin (LOVENOX) injection  30 mg Subcutaneous Q24H  . feeding supplement (GLUCERNA SHAKE)  237 mL Oral BID BM  . insulin aspart  0-15 Units Subcutaneous TID WC  . insulin aspart  0-5 Units Subcutaneous QHS  . irbesartan  300 mg Oral Daily  . metoprolol tartrate  25 mg Oral BID  . [START ON 05/08/2013] pneumococcal 23 valent vaccine  0.5 mL Intramuscular Tomorrow-1000     ASSESSMENT AND PLAN:   Elevated troponin - see below    Acute myocardial infarction, subendocardial infarction, initial episode of care - felt Type 2 in the setting of sepsis. On ASA, ARB, BB, statin. EF preserved/hyperdynamic by echo w/ grade 1 diast dys. Will increase metoprolol 25 mg BID to TID and see how tolerated. Possible d/c on 50 mg BID if does well. Could also change BB to Coreg at 12.5 bid, that may  give better BP control. BB is new this admit.    G-dtr spoke with other relatives by phone, they prefer OP f/u and decide on stress testing at that time. Will arrange, will see again PRN in-hosp.  Otherwise, per IM Principal Problem:   Sepsis Active Problems:   Weakness   SIRS (systemic inflammatory response syndrome)   Leukocytosis   Altered mental status   Acute renal failure   Elevated d-dimer   Otitis media   Essential hypertension, benign   Type II or unspecified type diabetes mellitus without mention of complication, not stated as uncontrolled   Anemia    Signed, Darrol JumpRhonda G Barrett , PA-C 10:25 AM 05/07/2013   The patient was seen, examined and discussed with Theodore Demarkhonda Barrett, PA-C and I agree with the above.   Continue conservative management for very small NSTEMI. Echo shows hyperdynamic EF without regional abnormalities.  Risk stratification with a Lexiscan Myoview as an  outpatient, once the patient recovers from an acute infection.  We will follow in our clinic. We would recommend to discharge on Metoprolol 50 mg PO BID.  Lars MassonKatarina H Okema Rollinson 05/07/2013

## 2013-05-09 LAB — CULTURE, BLOOD (ROUTINE X 2)
CULTURE: NO GROWTH
Culture: NO GROWTH

## 2013-05-18 ENCOUNTER — Encounter: Payer: Medicare Other | Admitting: Physician Assistant

## 2014-04-27 ENCOUNTER — Other Ambulatory Visit: Payer: Self-pay

## 2014-04-27 ENCOUNTER — Ambulatory Visit: Payer: Self-pay

## 2014-04-27 NOTE — Patient Outreach (Signed)
[facility] Care Management  04/27/2014  Garry HeaterMary W Escudero 11/04/1922 [id number]  Routine home visit scheduled for today. Care Manager called Member's home to inform that care manager was on the way. Spoke with Member's daughter, Kathy BreachRuby Jones, who states she had tried to call care manager to rescheduled appointment. Reporting that member did not want visit today. Per Ms Yetta BarreJones, Member request her primary care appointment be rescheduled also. Ms. Yetta BarreJones states member denies pain, stating, "I'm just fine". No new confusion, no fever, "only cough". Ms. Yetta BarreJones reports Dr. Allyne GeeSanders, primary care physician is aware and was instructed to give cough syrup. Ms Yetta BarreJones, states member's appetite has decreased some although member asked for some [place] soup which they are getting for her today. Care Manager emphasized importance of adequate hydration and encouraged to seek provider care for increased or worsening symptoms.  Ms. Yetta BarreJones stated she understood. Appointment rescheduled for next week.  Kathyrn SheriffJuana Markise Haymer, RN, MSN, [facility] Center[facility],CCM [facility] Community Care Coordinator Cell: [phone]

## 2014-05-06 ENCOUNTER — Other Ambulatory Visit: Payer: Self-pay

## 2014-05-06 NOTE — Patient Outreach (Signed)
[place] [facility]) Care Management  05/06/2014  Kathleen Weeks July 17, 1922 [id number]  Subjective: "I'm doing pretty good"  Objective: BP 170/78 mmHg  Pulse 80  Resp 22  SpO2 96%, lungs sounds essentially clear and improved, very faint end inspiratory crackles noted to left lobe. Skin warm dry. Outer left ankle trace puffy otherwise no edema noted.  Home Monitoring:  4/1 146/80 pulse 92; 135/82 pulse 94 4/4 177/99 pulse 70; 183/113 pulse 78; 180/101 pulse 73 (no medications given) 4/12 159/89 pulse 92; 151/86 pulse 70 4/14 179/104 pulse 75 (member had not had medications)   Assessment: 81 year old referred by primary care regarding hypertension. Member sleeping upon arrival, but easily aroused. Member's primary care giver is daughter Rigoberto Noel who provides day to day assistance. Member has not been to follow up with primary care provider since care manager's last visit. Member stated to her daughters that she was not going and daughter's rescheduled per member request. Daughter Bertram Millard gives member her medications and reports member has not been eating well, stating she takes bites . Member however states she has been eating. [name] states that member will drink Ensure supplement. [name] reports that she does not give member her medications when she does not eat because she thought that member should not take her medications unless she has eaten. Care Manager instructed that she could take her medications with the Ensure. Encouraged [name] to give member her medications even if she eats only a little bit. Reinforced the importance of member getting her medications and following up with primary care provider. Medications reviewed. Member drank Ensure and took medications while care manager present.  [facility] CM Care Plan        Patient Outreach from 05/06/2014 in [place] Problem One  knowledge deficit related to management of hypertension   Care Plan for Problem One  Active   Interventions for Problem One Long Term Goal  Discussed importance of taking medications, discused low salt diet, reinforced the importance of follow up with primary care   [facility] Long Term Goal (31-90 days)  Member/care giver will express 3 strategies to control hypertension within the next 90 days.   [facility] Long Term Goal Start Date  03/11/14   [facility] CM Short Term Goal #1 (0-30 days)  Caregiver will schedule follow up appointment within the week.   [facility] CM Short Term Goal #1 Start Date  05/06/14   [facility] CM Short Term Goal #2 (0-30 days)  caregiver will give member medications at prescribed within the next 30 days.   [facility] CM Short Term Goal #2 Start Date  05/05/20   [facility] CM Short Term Goal #2 Met Date  -- [goal continued]     Plan: Care Manager called Member's daughter [name] and informed that member does not have an appointment scheduled. Ms Ronnald Ramp to have sister(Dorothy) schedule a follow up appointment and call care manager back with date. Care manager will continue to follow and make Home visit next month.  Thea Silversmith, RN, MSN, [place] Coordinator Cell: [phone]

## 2014-05-28 ENCOUNTER — Other Ambulatory Visit: Payer: Self-pay

## 2014-05-28 NOTE — Patient Outreach (Signed)
[facility]) Care Management  05/28/2014  Garry HeaterMary W Allers 03/18/1922 [id number]  Assessment: Call to schedule home visit. No answer. Unable to leave message.   Plan: Care Manager will call again next week.  Kathyrn SheriffJuana Mihira Tozzi, RN, MSN, [facility] Hospital[facility],CCM [facility] Coordinator Cell: [phone]

## 2014-06-01 ENCOUNTER — Other Ambulatory Visit: Payer: Self-pay

## 2014-06-02 NOTE — Patient Outreach (Signed)
Kathleen Weeks Kathleen Weeks) Care Management  Kathleen Weeks  06/02/2014   Kathleen Weeks [birth year] [id number]  Subjective: "I'm doing fine"  Objective: BP 150/68 mmHg  Pulse 65  Resp 20  SpO2 99%, skin warm dry, lungs clear, heart rate regular.   Current Medications:  Current Outpatient Prescriptions  Medication Sig Dispense Refill  . aspirin 81 MG chewable tablet Chew 1 tablet (81 mg total) by mouth daily.    [name] dextromethorphan-guaiFENesin (MUCINEX DM) 30-600 MG per 12 hr tablet Take 1 tablet by mouth 2 (two) times daily.    [name] ENSURE (ENSURE) Take 237 mLs by mouth 3 (three) times daily between meals. 237 mL 12  . linagliptin (TRADJENTA) 5 MG TABS tablet Take 5 mg by mouth daily.    . metoprolol tartrate (LOPRESSOR) 25 MG tablet Take 1 tablet (25 mg total) by mouth 2 (two) times daily. 60 tablet 0  . cefUROXime (CEFTIN) 250 MG tablet Take 1 tablet (250 mg total) by mouth 2 (two) times daily with a meal. (Patient not taking: Reported on 05/06/2014) 20 tablet 0  . ciprofloxacin-dexamethasone (CIPRODEX) otic suspension Place 4 drops into the right ear 2 (two) times daily. (Patient not taking: Reported on 05/06/2014) 7.5 mL 0  . naproxen sodium (ANAPROX) 220 MG tablet Take 220 mg by mouth 2 (two) times daily with a meal.    . olmesartan (BENICAR) 40 MG tablet Take 1 tablet (40 mg total) by mouth daily. (Patient not taking: Reported on 05/06/2014) 30 tablet 0  . oxyCODONE (OXY IR/ROXICODONE) 5 MG immediate release tablet Take 1 tablet (5 mg total) by mouth every 4 (four) hours as needed for moderate pain. (Patient not taking: Reported on 05/06/2014) 30 tablet 0  . pneumococcal 23 valent vaccine (PNU-IMMUNE) 25 MCG/0.5ML injection Inject 0.5 mLs into the muscle once. GIVEN AT DISCHARGE (Patient not taking: Reported on 06/01/2014) 2.5 mL 0   No current facility-administered medications for this visit.    Functional Status:  In your present state of health, do you have any difficulty  performing the following activities: 06/01/2014  Hearing? Y  Vision? Y  Difficulty concentrating or making decisions? Y  Walking or climbing stairs? Y  Dressing or bathing? Y  Doing errands, shopping? Y  Preparing Food and eating ? Y  Using the Toilet? N  In the past six months, have you accidently leaked urine? (No Data)  Do you have problems with loss of bowel control? N  Managing your Medications? Y  Managing your Finances? Y  Housekeeping or managing your Housekeeping? Y    Fall/Depression Screening: PHQ 2/9 Scores 06/01/2014  PHQ - 2 Score 0    Assessment: 81 year old with history of hypertension. Kathleen Weeks has been following for care coordination and disease education regarding hypertension. Member's primary care giver include her daughters, Kathleen Weeks and Kathleen Weeks. Both daughter reports feeling more comfortable in how the manage hypertension. They are monitoring blood pressure, know target blood pressure range and when to call the doctor. Member has follow up appointment scheduled. Medications reviewed and member is taking medications as ordered. Member denies any problems or issues. Kathleen Weeks and Mrs. Kathleen Weeks without any questions or concerns. No other care management needs identified. Discussed case closure. Kathleen Weeks has care manager contact number and encouraged to call as needed. Also, Daughters are aware of 24 hour nurse advice line availability.  Plan: close case. Send case closure letter to primary care.  [facility] CM Care Plan Problem One  Patient Outreach from 06/01/2014 in [place] Problem One  knowledge deficit related to management of hypertension   Care Plan for Problem One  Active   [facility] Long Term Goal (31-90 days)  Member/care giver will express 3 strategies to control hypertension within the next 90 days.   [facility] Long Term Goal Start Date  03/11/14   [facility] Long Term Goal Met Date  06/01/14 [medications, watch salt,monitor, call  primary care as needed]   Interventions for Problem One Long Term Goal  reinforced medications, low salt diet and monitoring and follow up    [facility] CM Short Term Goal #1 (0-30 days)  Caregiver will schedule follow up appointment within the week.   [facility] CM Short Term Goal #1 Start Date  05/06/14   [facility] CM Short Term Goal #1 Met Date  06/01/14 [had follow up appointment with primary care on 05/18/14]   Interventions for Short Term Goal #1  follow up appointment completed 4/26.   [facility] CM Short Term Goal #2 (0-30 days)  caregiver will give member medications at prescribed within the next 30 days.   [facility] CM Short Term Goal #2 Start Date  05/05/20   [facility] CM Short Term Goal #2 Met Date  06/01/14   Interventions for Short Term Goal #2  discussed importance of taking medications, encouraged cargiver to give medication with ensure supplement or with what portion of the meal member will eat. [reinforced importance of presceibed medications ]

## 2014-06-03 NOTE — Patient Outreach (Signed)
[place]) Care Management  06/03/2014  Shacarra Choe Arboleda 01-10-1923 [id number]   Received notification from Thea Silversmith, RN to close case due to goals met.  Ronnell Freshwater. [place] CM Assistant Phone: [phone] Fax: [phone]

## 2014-11-29 IMAGING — CR DG SHOULDER 2+V*L*
3 series · 3 of 3 positions shown · non-contrast
Comparison: None.

CLINICAL DATA: Left shoulder pain and limited range of motion.

EXAM:
LEFT SHOULDER - 2+ VIEW

[x shoulder axillary left]
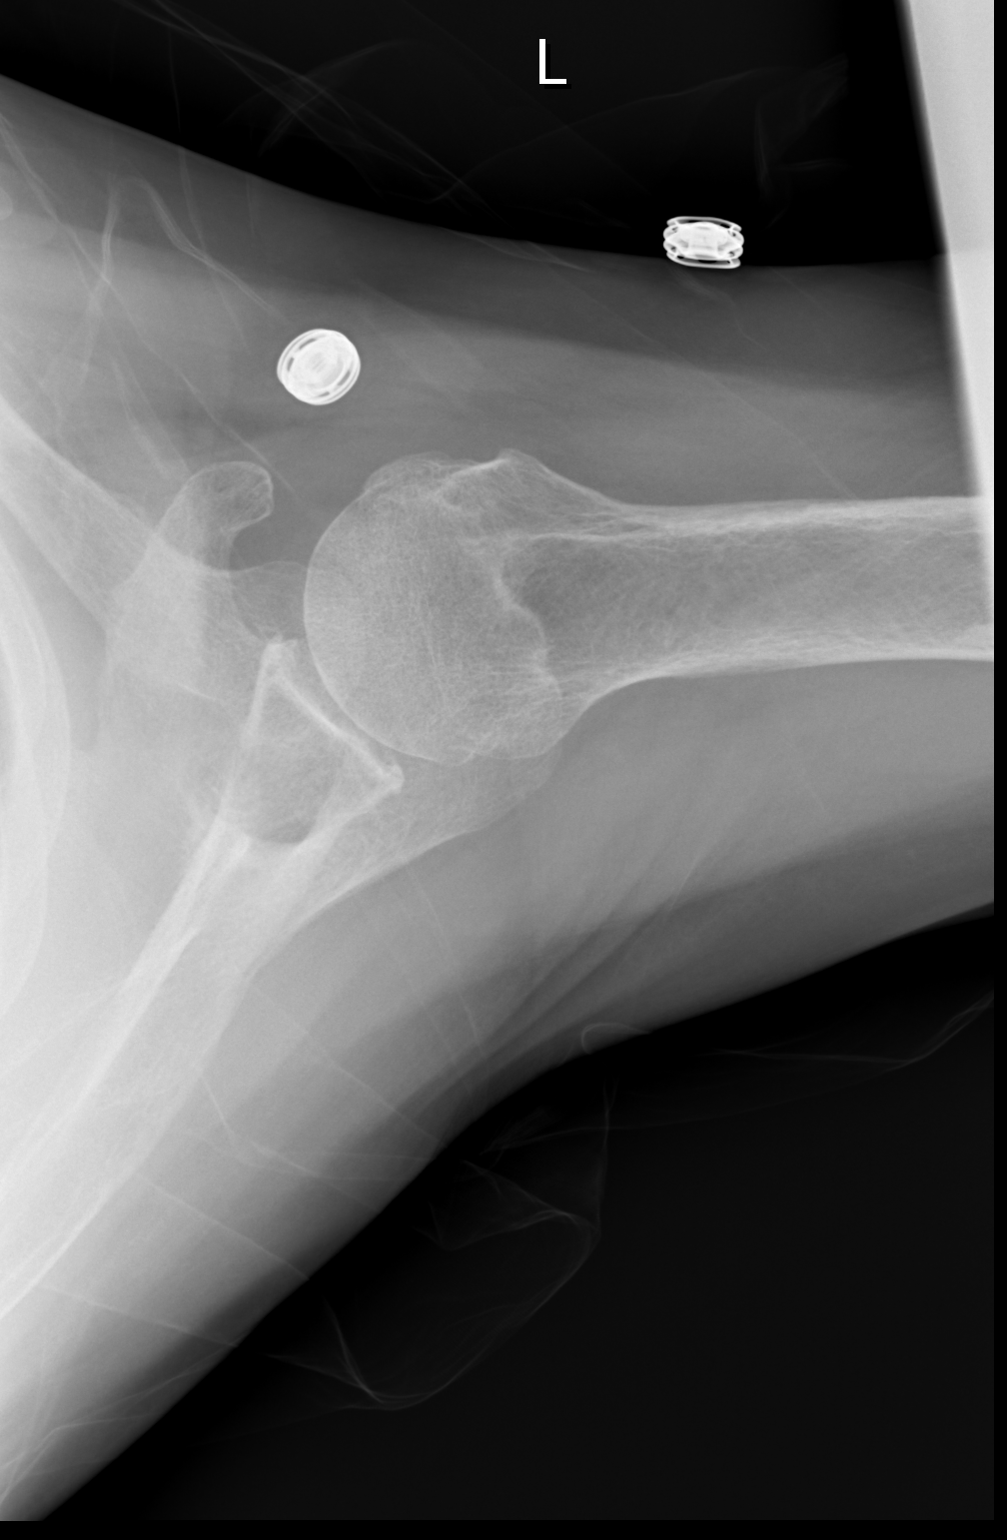

[x shoulder ap left]
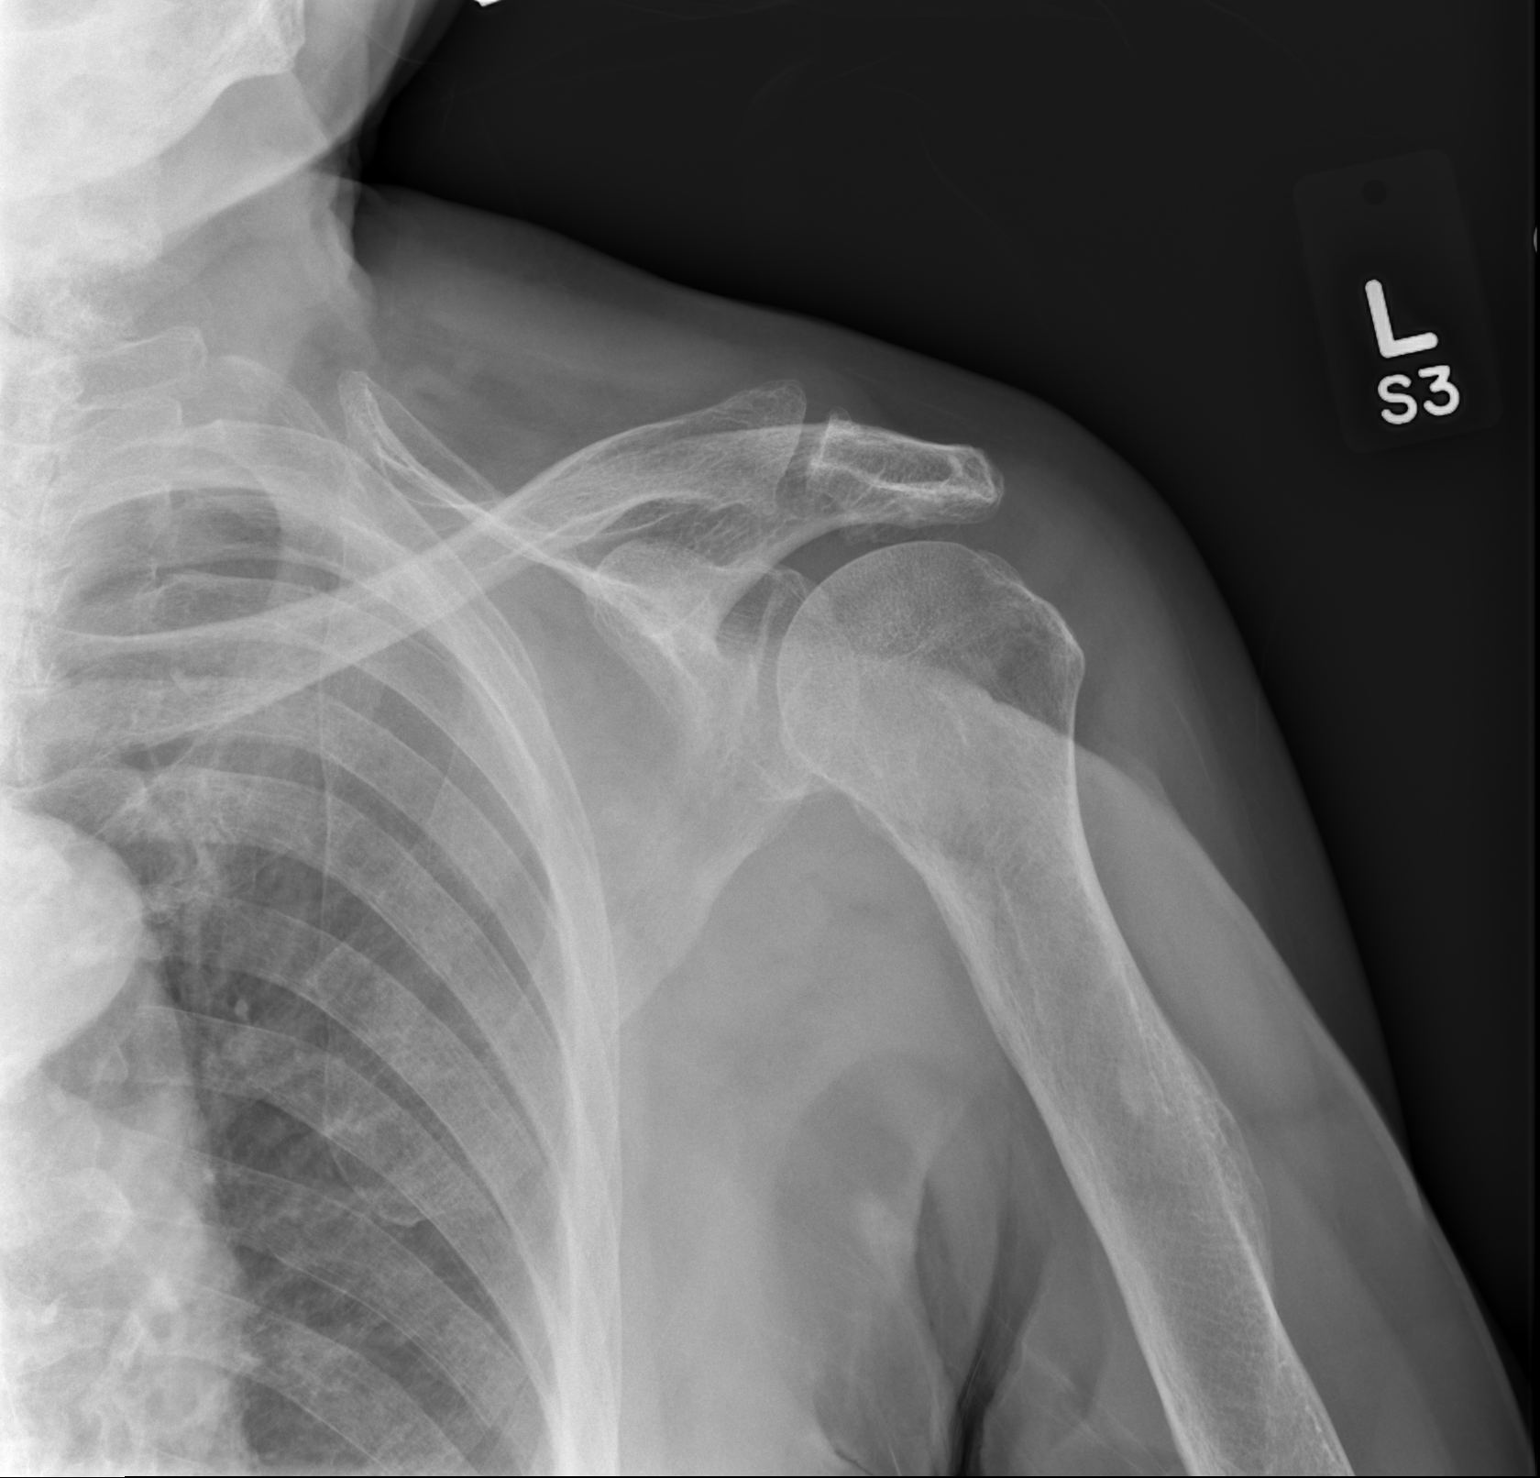

[x scapula y-view left]
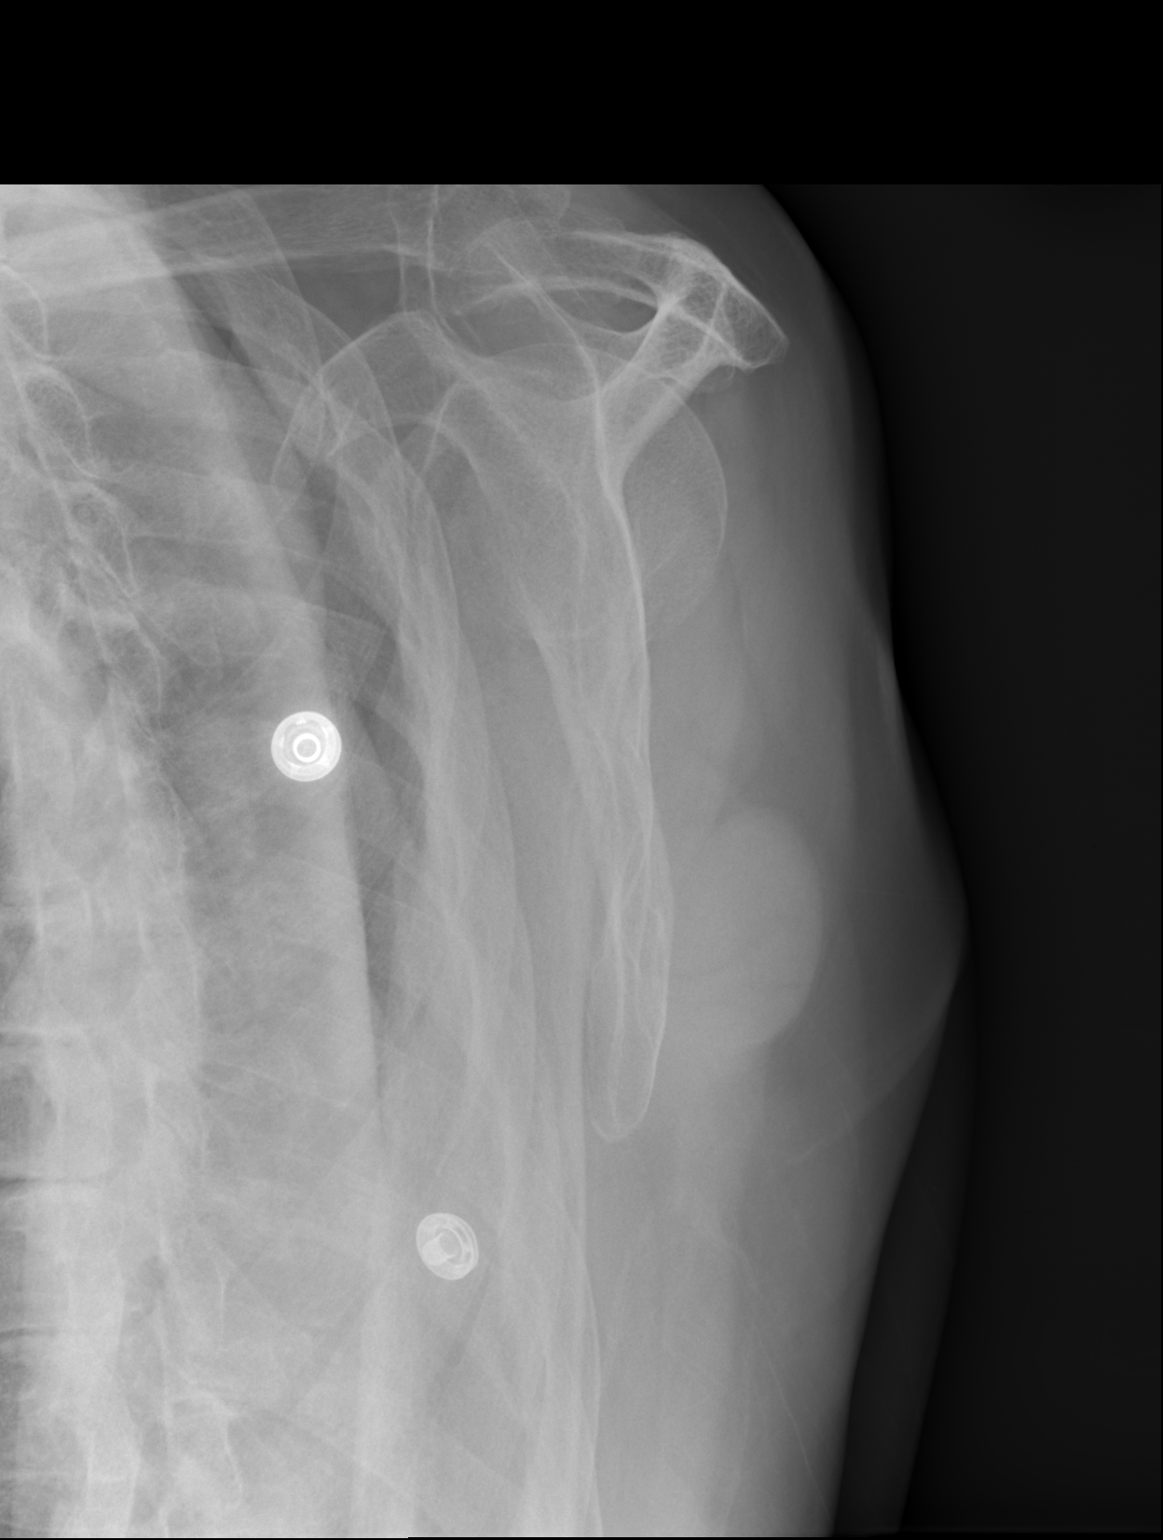

[3 of 3 positions shown; findings below may reference images not displayed]

FINDINGS: Deep joint spaces are maintained. Mild degenerative changes. No
acute fracture the left lung is clear.
IMPRESSION: Degenerative changes but no acute bony findings.

## 2014-12-15 ENCOUNTER — Encounter (HOSPITAL_COMMUNITY): Payer: Self-pay | Admitting: Emergency Medicine

## 2014-12-15 ENCOUNTER — Emergency Department (HOSPITAL_COMMUNITY)
Admission: EM | Admit: 2014-12-15 | Discharge: 2014-12-15 | Disposition: A | Discharge status: Home / Self Care | Payer: Medicare Other | Attending: Emergency Medicine | Admitting: Emergency Medicine

## 2014-12-15 ENCOUNTER — Emergency Department (HOSPITAL_COMMUNITY): Payer: Medicare Other

## 2014-12-15 DIAGNOSIS — R0602 Shortness of breath: Secondary | ICD-10-CM | POA: Diagnosis present

## 2014-12-15 DIAGNOSIS — Z87448 Personal history of other diseases of urinary system: Secondary | ICD-10-CM | POA: Insufficient documentation

## 2014-12-15 DIAGNOSIS — Z7982 Long term (current) use of aspirin: Secondary | ICD-10-CM | POA: Diagnosis not present

## 2014-12-15 DIAGNOSIS — E119 Type 2 diabetes mellitus without complications: Secondary | ICD-10-CM | POA: Insufficient documentation

## 2014-12-15 DIAGNOSIS — J40 Bronchitis, not specified as acute or chronic: Secondary | ICD-10-CM | POA: Diagnosis not present

## 2014-12-15 DIAGNOSIS — Z79899 Other long term (current) drug therapy: Secondary | ICD-10-CM | POA: Diagnosis not present

## 2014-12-15 DIAGNOSIS — I10 Essential (primary) hypertension: Secondary | ICD-10-CM | POA: Diagnosis not present

## 2014-12-15 DIAGNOSIS — M1990 Unspecified osteoarthritis, unspecified site: Secondary | ICD-10-CM | POA: Diagnosis not present

## 2014-12-15 DIAGNOSIS — R630 Anorexia: Secondary | ICD-10-CM | POA: Insufficient documentation

## 2014-12-15 DIAGNOSIS — R531 Weakness: Secondary | ICD-10-CM | POA: Diagnosis not present

## 2014-12-15 DIAGNOSIS — Z87891 Personal history of nicotine dependence: Secondary | ICD-10-CM | POA: Diagnosis not present

## 2014-12-15 DIAGNOSIS — Z791 Long term (current) use of non-steroidal anti-inflammatories (NSAID): Secondary | ICD-10-CM | POA: Diagnosis not present

## 2014-12-15 LAB — CBC WITH DIFFERENTIAL/PLATELET
Basophils Absolute: 0 10*3/uL (ref 0.0–0.1)
Basophils Relative: 0 %
EOS ABS: 0 10*3/uL (ref 0.0–0.7)
Eosinophils Relative: 1 %
HCT: 37.6 % (ref 36.0–46.0)
HEMOGLOBIN: 12.2 g/dL (ref 12.0–15.0)
LYMPHS ABS: 1.7 10*3/uL (ref 0.7–4.0)
LYMPHS PCT: 21 %
MCH: 31.5 pg (ref 26.0–34.0)
MCHC: 32.4 g/dL (ref 30.0–36.0)
MCV: 97.2 fL (ref 78.0–100.0)
Monocytes Absolute: 1.2 10*3/uL — ABNORMAL HIGH (ref 0.1–1.0)
Monocytes Relative: 15 %
NEUTROS PCT: 63 %
Neutro Abs: 5.2 10*3/uL (ref 1.7–7.7)
Platelets: 236 10*3/uL (ref 150–400)
RBC: 3.87 MIL/uL (ref 3.87–5.11)
RDW: 13.2 % (ref 11.5–15.5)
WBC: 8.2 10*3/uL (ref 4.0–10.5)

## 2014-12-15 LAB — BASIC METABOLIC PANEL
ANION GAP: 15 (ref 5–15)
BUN: 31 mg/dL — AB (ref 6–20)
CHLORIDE: 101 mmol/L (ref 101–111)
CO2: 20 mmol/L — ABNORMAL LOW (ref 22–32)
Calcium: 9.4 mg/dL (ref 8.9–10.3)
Creatinine, Ser: 1.46 mg/dL — ABNORMAL HIGH (ref 0.44–1.00)
GFR calc Af Amer: 35 mL/min — ABNORMAL LOW (ref >60)
GFR calc non Af Amer: 30 mL/min — ABNORMAL LOW (ref >60)
Glucose, Bld: 199 mg/dL — ABNORMAL HIGH (ref 65–99)
POTASSIUM: 4.1 mmol/L (ref 3.5–5.1)
SODIUM: 136 mmol/L (ref 135–145)

## 2014-12-15 LAB — I-STAT TROPONIN, ED: TROPONIN I, POC: 0.03 ng/mL (ref 0.00–0.08)

## 2014-12-15 MED ORDER — SODIUM CHLORIDE 0.9 % IV BOLUS (SEPSIS): 1000.0000 mL | Freq: Once | INTRAVENOUS | Status: DC

## 2014-12-15 MED ORDER — IPRATROPIUM-ALBUTEROL 0.5-2.5 (3) MG/3ML IN SOLN
3.0000 mL | Freq: Once | RESPIRATORY_TRACT | Status: AC
  Administered 2014-12-15: 3 mL via RESPIRATORY_TRACT
  Filled 2014-12-15: qty 3

## 2014-12-15 MED ORDER — AEROCHAMBER PLUS W/MASK MISC
1.0000 | Freq: Once | Status: AC
  Administered 2014-12-15: 1
  Filled 2014-12-15: qty 1

## 2014-12-15 MED ORDER — ALBUTEROL SULFATE HFA 108 (90 BASE) MCG/ACT IN AERS
2.0000 | INHALATION_SPRAY | Freq: Once | RESPIRATORY_TRACT | Status: AC
  Administered 2014-12-15: 2 via RESPIRATORY_TRACT
  Filled 2014-12-15: qty 6.7

## 2014-12-15 MED ORDER — PREDNISONE 20 MG PO TABS: 40.0000 mg | ORAL_TABLET | Freq: Every day | ORAL | Status: AC

## 2014-12-15 NOTE — Discharge Instructions (Signed)
Inhaler 2 puffs every 4 hrs. Take prednisone as prescribed daily until all gone, next dose tomorrow. Follow up with primary care doctor.   Chronic Bronchitis Chronic bronchitis is a lasting inflammation of the bronchial tubes, which are the tubes that carry air into your lungs. This is inflammation that occurs:   On most days of the week.   For at least three months at a time.   Over a period of two years in a row. When the bronchial tubes are inflamed, they start to produce mucus. The inflammation and buildup of mucus make it more difficult to breathe. Chronic bronchitis is usually a permanent problem and is one type of chronic obstructive pulmonary disease (COPD). People with chronic bronchitis are at greater risk for getting repeated colds, or respiratory infections. CAUSES  Chronic bronchitis most often occurs in people who have:  Long-standing, severe asthma.  A history of smoking.  Asthma and who also smoke. SIGNS AND SYMPTOMS  Chronic bronchitis may cause the following:   A cough that brings up mucus (productive cough).  Shortness of breath.  Early morning headache.  Wheezing.  Chest discomfort.   Recurring respiratory infections. DIAGNOSIS  Your health care provider may confirm the diagnosis by:  Taking your medical history.  Performing a physical exam.  Taking a chest X-ray.   Performing pulmonary function tests. TREATMENT  Treatment involves controlling symptoms with medicines, oxygen therapy, or making lifestyle changes, such as exercising and eating a healthy, well-balanced diet. Medicines could include:  Inhalers to improve air flow in and out of your lungs.  Antibiotics to treat bacterial infections, such as pneumonia, sinus infections, and acute bronchitis. As a preventative measure, your health care provider may recommend routine vaccinations for influenza and pneumonia. This is to prevent infection and hospitalization since you may be more at risk  for these types of infections.  HOME CARE INSTRUCTIONS  Take medicines only as directed by your health care provider.   If you smoke cigarettes, chew tobacco, or use electronic cigarettes, quit. If you need help quitting, ask your health care provider.  Avoid pollen, dust, animal dander, molds, smoke, and other things that cause shortness of breath or wheezing attacks.  Talk to your health care provider about possible exercise routines. Regular exercise is very important to help you feel better.  If you are prescribed oxygen use at home follow these guidelines:  Never smoke while using oxygen. Oxygen does not burn or explode, but flammable materials will burn faster in the presence of oxygen.  Keep a Government social research officerfire extinguisher close by. Let your fire department know that you have oxygen in your home.  Warn visitors not to smoke near you when you are using oxygen. Put up "no smoking" signs in your home where you most often use the oxygen.  Regularly test your smoke detectors at home to make sure they work. If you receive care in your home from a nurse or other health care provider, he or she may also check to make sure your smoke detectors work.  Ask your health care provider whether you would benefit from a pulmonary rehabilitation program.  Do not wait to get medical care if you have any concerning symptoms. Delays could cause permanent injury and may be life threatening. SEEK MEDICAL CARE IF:  You have increased coughing or shortness of breath or both.  You have muscle aches.  You have chest pain.  Your mucus gets thicker.  Your mucus changes from clear or white to yellow,  green, gray, or bloody. SEEK IMMEDIATE MEDICAL CARE IF:  Your usual medicines do not stop your wheezing.   You have increased difficulty breathing.   You have any problems with the medicine you are taking, such as a rash, itching, swelling, or trouble breathing. MAKE SURE YOU:   Understand these  instructions.  Will watch your condition.  Will get help right away if you are not doing well or get worse.   This information is not intended to replace advice given to you by your health care provider. Make sure you discuss any questions you have with your health care provider.   Document Released: 10/26/2005 Document Revised: 01/29/2014 Document Reviewed: 02/16/2013 Elsevier Interactive Patient Education [employer]

## 2014-12-15 NOTE — ED Provider Notes (Signed)
CSN: [id number]     Arrival date & time 12/15/14  1318 History   First MD Initiated Contact with Patient 12/15/14 1358     Chief Complaint  Patient presents with  . Shortness of Breath  . loss of appetite      (Consider location/radiation/quality/duration/timing/severity/associated sxs/prior Treatment) HPI Kathleen Weeks is a 81 y.o. female with history of hypertension, diabetes, renal insufficiency, presents to emergency department complaining of cough and generalized weakness. Patient's family provides most the history. They state the patient has had a cough for "years and years." They state that at times he gets worse. Daughter states in the last several days they have noticed that there is more "rattling in her chest." They also states that she seemed like she was having hard time breathing yesterday and today. Family denies any fever. Patient denies any pain. She has been taking Mucinex for her call for "years." Family states that is not helping. Patient denies any extremity swelling. No recent travel or surgeries. Poor appetite, otherwise does not appear to be weaker than normal to the family. No other complaints.   Past Medical History  Diagnosis Date  . Hypertension   . Diabetes mellitus   . Arthritis   . Renal insufficiency    Past Surgical History  Procedure Laterality Date  . Back surgery     No family history on file. Social History  Substance Use Topics  . Smoking status: Former Games developermoker  . Smokeless tobacco: Never Used  . Alcohol Use: No   OB History    No data available     Review of Systems  Constitutional: Negative for fever and chills.  HENT: Negative for congestion.   Respiratory: Positive for cough, shortness of breath and wheezing. Negative for choking and chest tightness.   Cardiovascular: Negative for chest pain, palpitations and leg swelling.  Gastrointestinal: Negative for nausea, vomiting, abdominal pain and diarrhea.  Genitourinary: Negative for  dysuria and flank pain.  Musculoskeletal: Negative for myalgias, arthralgias, neck pain and neck stiffness.  Skin: Negative for rash.  Neurological: Negative for dizziness, weakness and headaches.  All other systems reviewed and are negative.     Allergies  Review of patient's allergies indicates no known allergies.  Home Medications   Prior to Admission medications   Medication Sig Start Date End Date Taking? Authorizing Provider  aspirin 81 MG chewable tablet Chew 1 tablet (81 mg total) by mouth daily. 05/07/13  Yes Russella DarAllison L Ellis, NP  dextromethorphan-guaiFENesin [facility](MUCINEX DM) 30-600 MG per 12 hr tablet Take 1 tablet by mouth 2 (two) times daily.   Yes Historical Provider, MD  ENSURE (ENSURE) Take 237 mLs by mouth 3 (three) times daily between meals. 05/07/13  Yes Russella DarAllison L Ellis, NP  linagliptin (TRADJENTA) 5 MG TABS tablet Take 5 mg by mouth daily.   Yes Historical Provider, MD  metoprolol tartrate (LOPRESSOR) 25 MG tablet Take 1 tablet (25 mg total) by mouth 2 (two) times daily. 05/07/13  Yes Russella DarAllison L Ellis, NP  naproxen sodium (ANAPROX) 220 MG tablet Take 220 mg by mouth 2 (two) times daily with a meal.   Yes Historical Provider, MD  cefUROXime (CEFTIN) 250 MG tablet Take 1 tablet (250 mg total) by mouth 2 (two) times daily with a meal. Patient not taking: Reported on 05/06/2014 05/07/13   Russella DarAllison L Ellis, NP  ciprofloxacin-dexamethasone (CIPRODEX) otic suspension Place 4 drops into the right ear 2 (two) times daily. Patient not taking: Reported on 05/06/2014 05/07/13  Russella Dar, NP  olmesartan (BENICAR) 40 MG tablet Take 1 tablet (40 mg total) by mouth daily. Patient not taking: Reported on 05/06/2014 05/07/13   Russella Dar, NP  oxyCODONE (OXY IR/ROXICODONE) 5 MG immediate release tablet Take 1 tablet (5 mg total) by mouth every 4 (four) hours as needed for moderate pain. Patient not taking: Reported on 05/06/2014 05/07/13   Russella Dar, NP  pneumococcal 23 valent vaccine  (PNU-IMMUNE) 25 MCG/0.5ML injection Inject 0.5 mLs into the muscle once. GIVEN AT DISCHARGE Patient not taking: Reported on 06/01/2014 05/07/13   Russella Dar, NP   BP 148/68 mmHg  Pulse 84  Temp(Src) 98.2 F (36.8 C) (Oral)  Resp 16  Wt 51.71 kg  SpO2 97% Physical Exam  Constitutional: She is oriented to person, place, and time. She appears well-developed and well-nourished. No distress.  HENT:  Head: Normocephalic.  Eyes: Conjunctivae are normal.  Neck: Neck supple.  Cardiovascular: Normal rate, regular rhythm and normal heart sounds.   Pulmonary/Chest: Effort normal. No respiratory distress. She has wheezes. She has no rales.  Expiratory wheezes in all lung fields  Abdominal: Soft. Bowel sounds are normal. She exhibits no distension. There is no tenderness. There is no rebound.  Musculoskeletal: She exhibits no edema.  Neurological: She is alert and oriented to person, place, and time.  Skin: Skin is warm and dry.  Psychiatric: She has a normal mood and affect. Her behavior is normal.  Nursing note and vitals reviewed.   ED Course  Procedures (including critical care time) Labs Review Labs Reviewed  CBC WITH DIFFERENTIAL/PLATELET - Abnormal; Notable for the following:    Monocytes Absolute 1.2 (*)    All other components within normal limits  BASIC METABOLIC PANEL - Abnormal; Notable for the following:    CO2 20 (*)    Glucose, Bld 199 (*)    BUN 31 (*)    Creatinine, Ser 1.46 (*)    GFR calc non Af Amer 30 (*)    GFR calc Af Amer 35 (*)    All other components within normal limits  I-STAT TROPOININ, ED    Imaging Review Dg Chest 2 View  12/15/2014  CLINICAL DATA:  Shortness of breath, hypertension, diabetic EXAM: CHEST  2 VIEW COMPARISON:  05/03/2013 FINDINGS: Heart is upper limits normal in size. Lungs are clear. No effusions. No acute bony abnormality. Bilateral hilar fullness is noted and is unchanged dating back to 02/03/2004. This likely reflects vasculature.  IMPRESSION: No active cardiopulmonary disease. Electronically Signed   By: Charlett Nose M.D.   On: 12/15/2014 14:38   I have personally reviewed and evaluated these images and lab results as part of my medical decision-making.   EKG Interpretation   Date/Time:  Wednesday December 15 2014 13:36:28 EST Ventricular Rate:  82 PR Interval:  160 QRS Duration: 76 QT Interval:  402 QTC Calculation: 469 R Axis:   53 Text Interpretation:  Normal sinus rhythm Left ventricular hypertrophy  with repolarization abnormality Abnormal ECG No significant change since  last tracing Confirmed by FLOYD MD, Reuel Boom ([id number]) on 12/15/2014 4:00:31  PM      MDM   Final diagnoses:  Bronchitis    patient with cough, wheezing on exam. Vital signs are normal. Will get basic labs, chest x-ray, EKG and troponin. Breathing treatment ordered   4:58 PM EKG with no acute findings. Troponin is negative. Chest x-ray is negative. Patient felt much better after breathing treatment. Discussed with Dr. Adela Lank who  assist patient as well. Most likely acute exacerbation of chronic bronchitis. Will start on low-dose prednisone for 5 days. Inhaler which was provided emergency department. Follow-up with primary care doctor. Patient is afebrile, nontoxic appearing.   Filed Vitals:   12/15/14 1545 12/15/14 1600 12/15/14 1615 12/15/14 1647  BP: 155/57 161/82 186/72 154/74  Pulse: 77 72 76 81  Temp:    98.2 F (36.8 C)  TempSrc:    Oral  Resp: Weight:      SpO2: 93% 98% 94% 95%     Jaynie Crumble, PA-C 12/15/14 1933  Melene Plan, DO 12/16/14 1351

## 2014-12-15 NOTE — ED Notes (Signed)
Pt from home with daughter for eval of sob, unknown period of time. Pt reports no pain at this time, family reports "rattling in her chest when she coughs." daughter poor historian for any additional information, states pt lives with another daughter who is not here at this time. Pt in nad noted, skin warm and dry.

## 2014-12-15 NOTE — ED Notes (Signed)
Patient transported to X-ray 

## 2015-05-25 IMAGING — CR DG CHEST 1V PORT
1 series · 1 of 1 positions shown · non-contrast
Comparison: DG CHEST 2 VIEW dated 05/03/2013; DG CHEST 2 VIEW dated
04/07/2013

CLINICAL DATA: Sepsis.

EXAM:
PORTABLE CHEST - 1 VIEW

[AP]
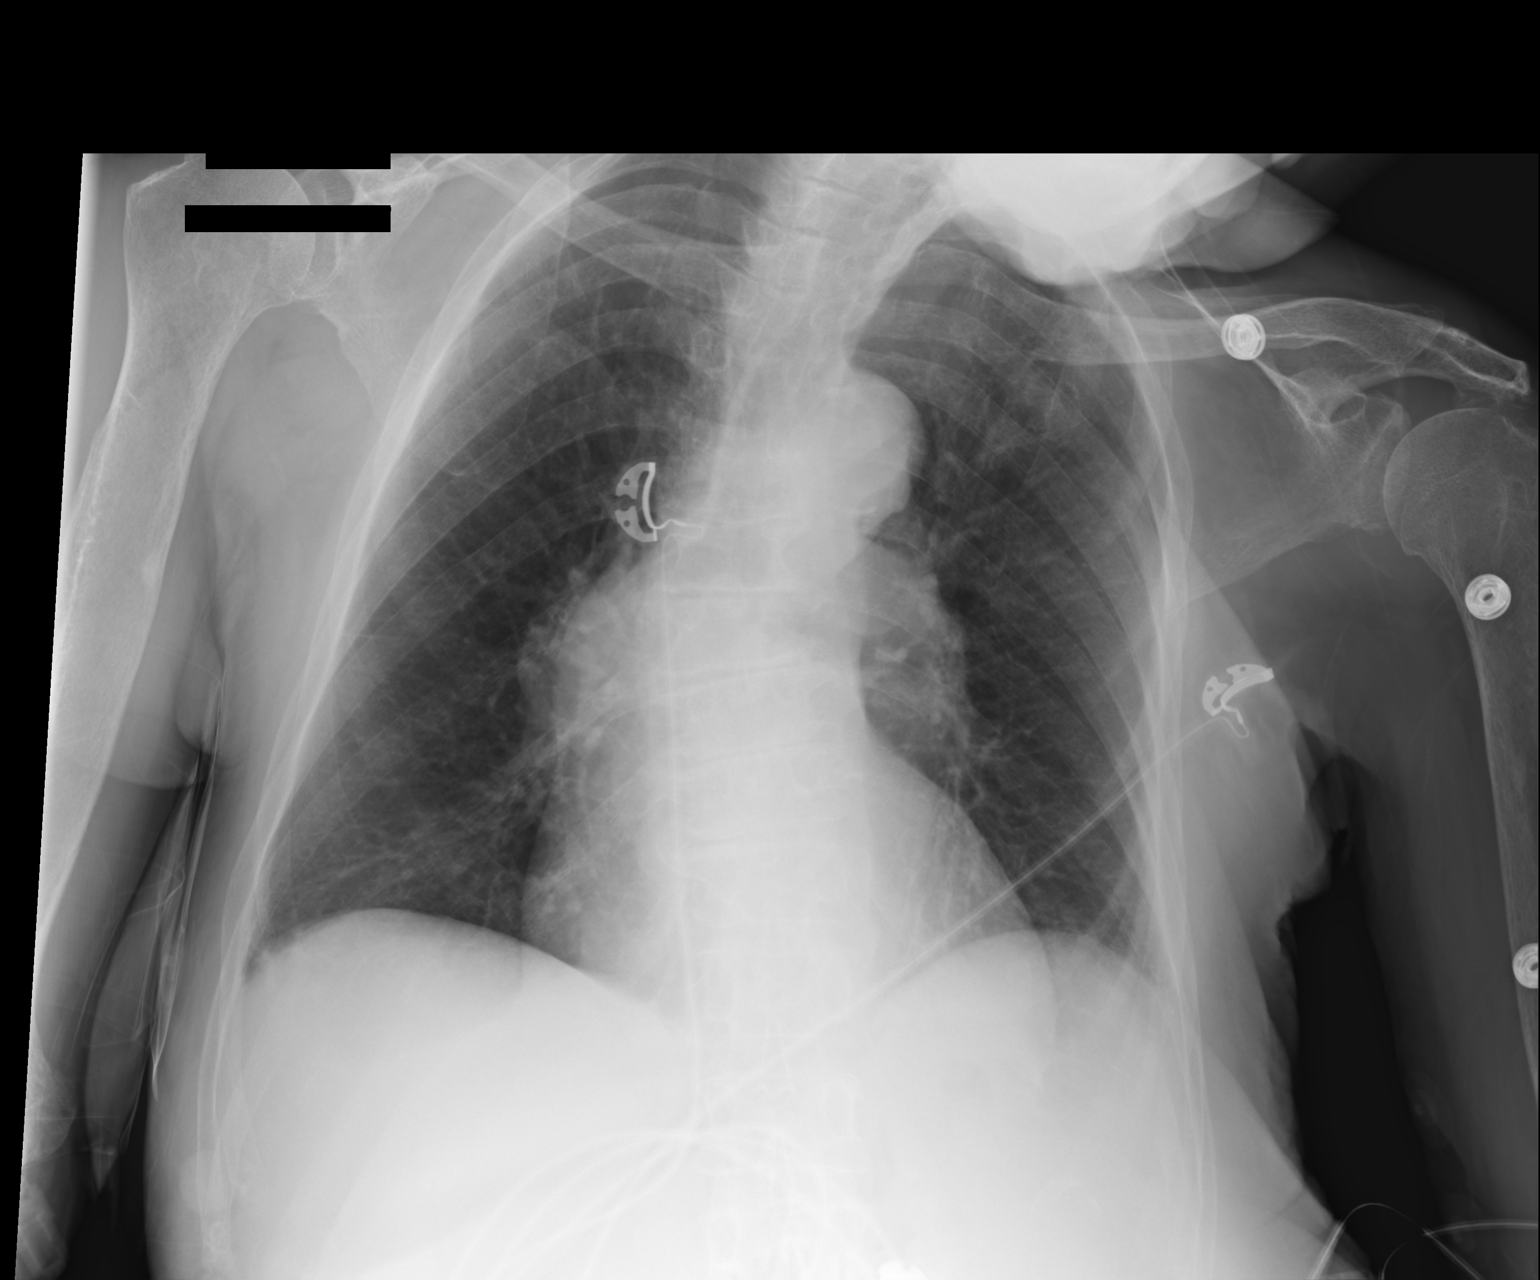

[1 of 1 positions shown; findings below may reference images not displayed]

FINDINGS: There is no evidence of pulmonary edema, consolidation,
pneumothorax, nodule or pleural fluid. The heart size is normal.
There is stable prominence of central pulmonary arteries. No bony
abnormalities are seen.
IMPRESSION: No active disease.

## 2015-05-25 IMAGING — CR DG PELVIS 1-2V
2 series · 2 of 2 positions shown · non-contrast
Comparison: None

CLINICAL DATA: Hip pain for 3 days

EXAM:
PELVIS - 1-2 VIEW

[t pelvis a.p. (1 of 2)]
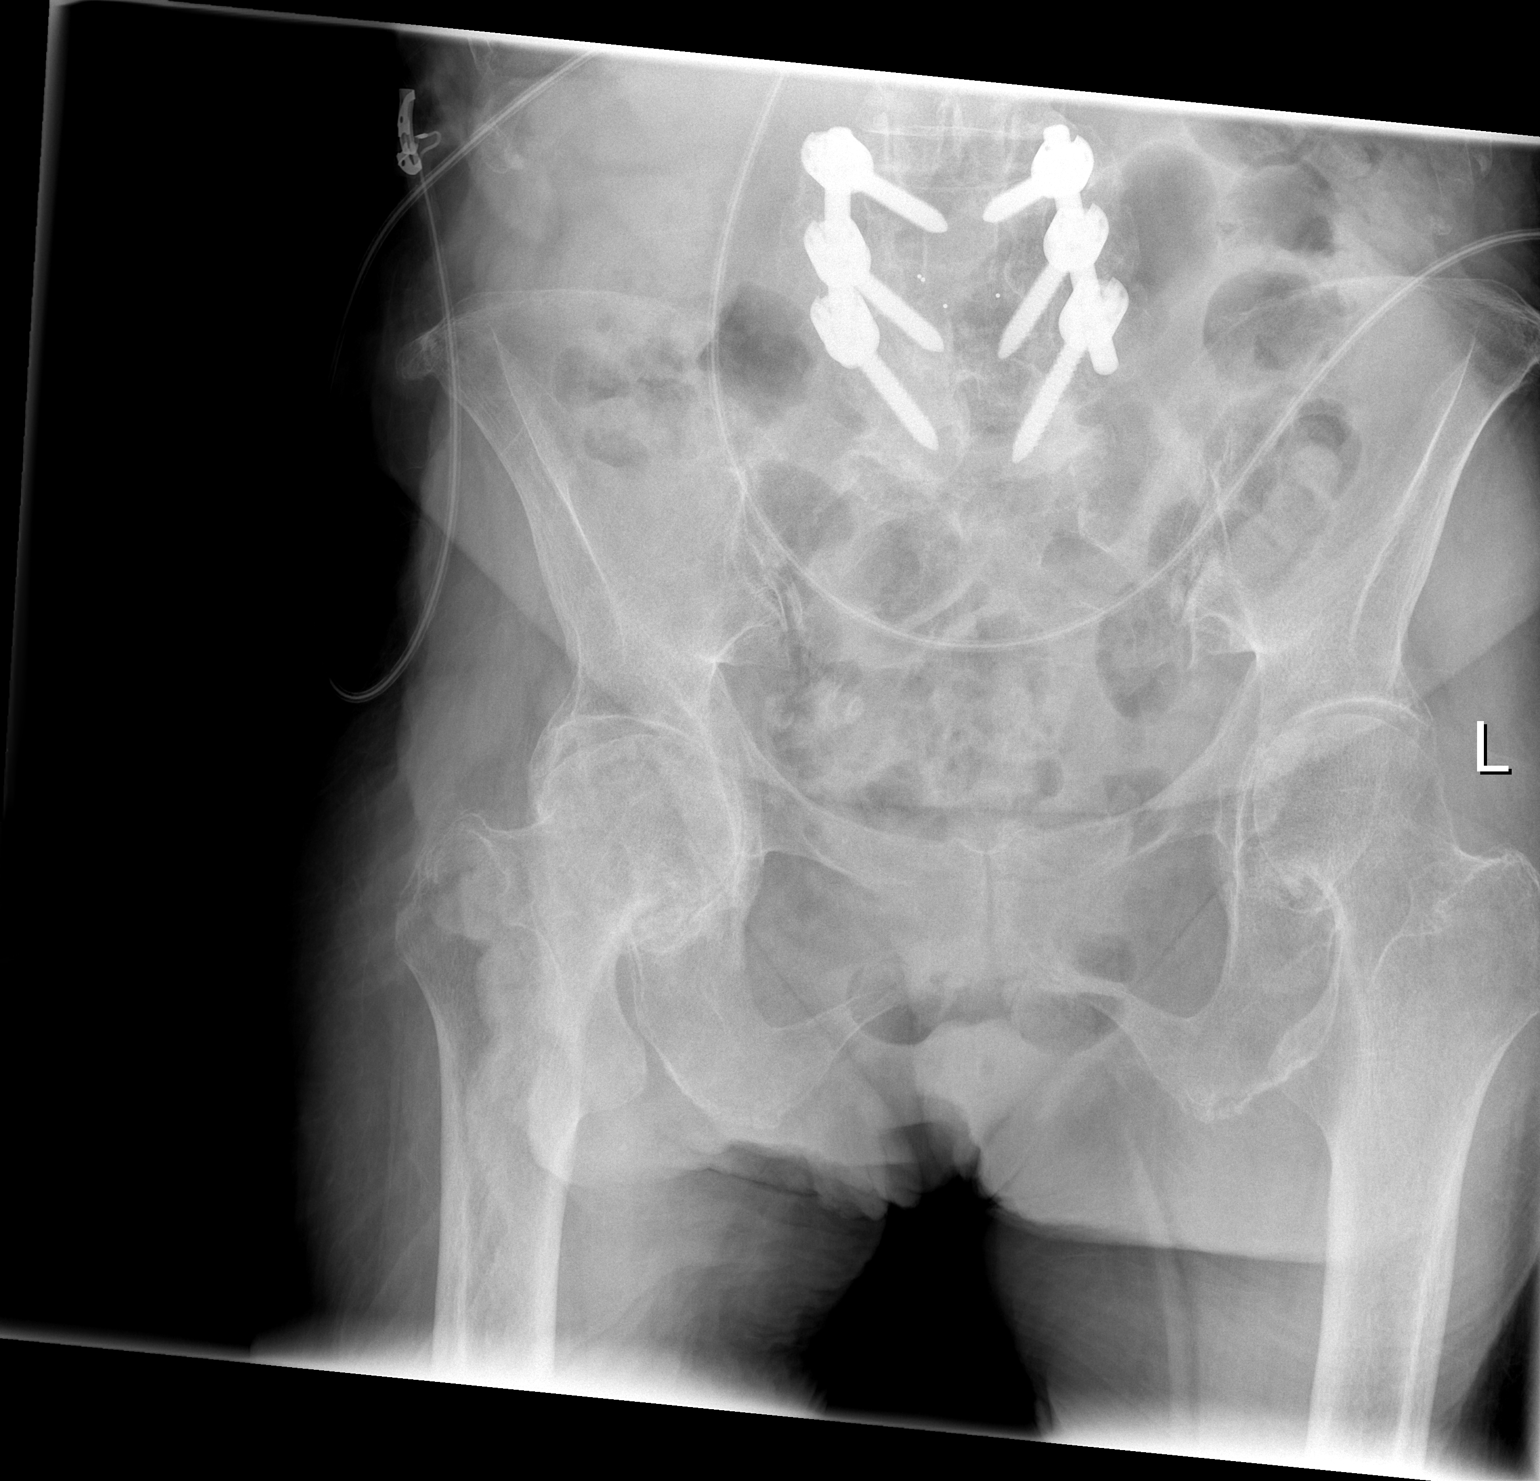

[t pelvis a.p. (2 of 2)]
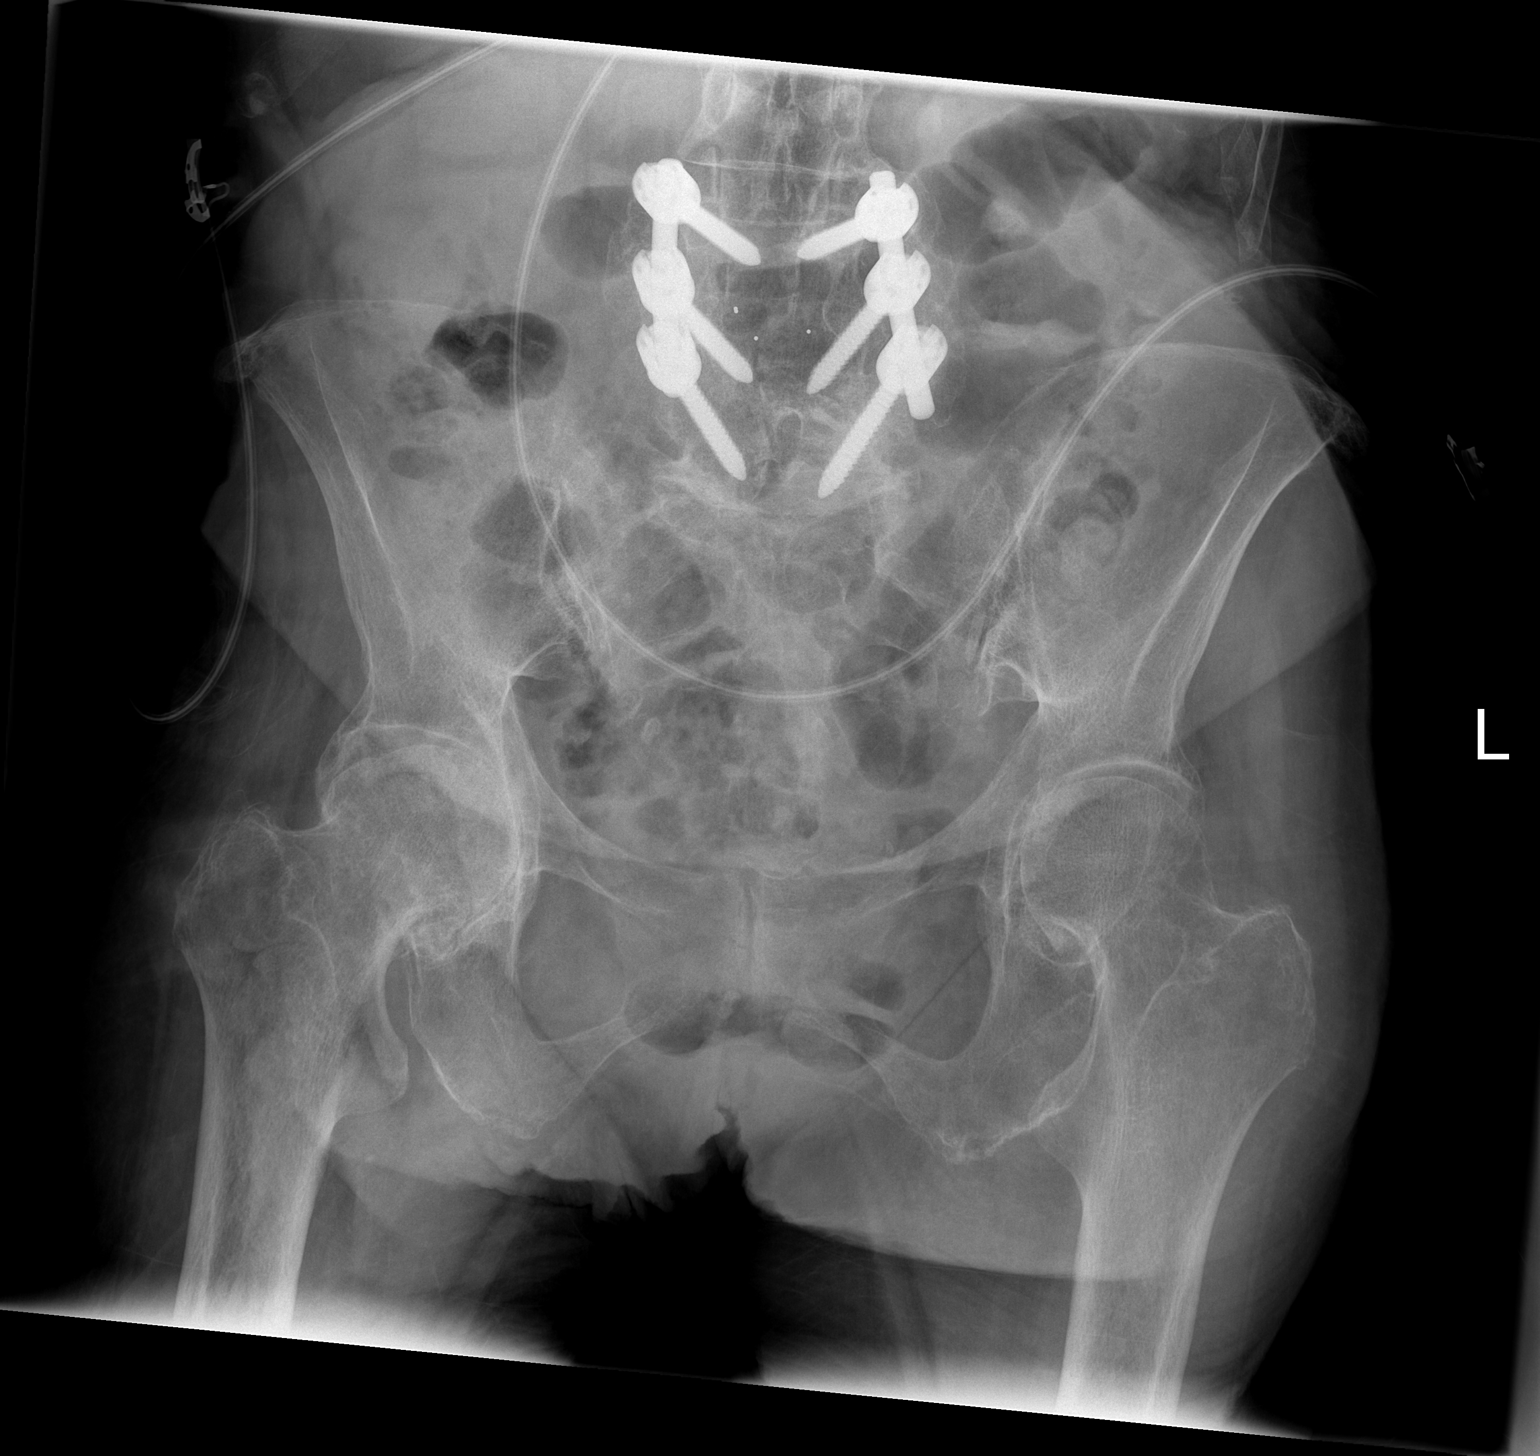

[2 of 2 positions shown; findings below may reference images not displayed]

FINDINGS: Bones are osteopenic diminishing sensitivity for nondisplaced
fracture. Severe osteoarthritis with mild protrusio deformity
involves the right hip. There is moderate osteoarthritis involving
the left hip. No acute fracture or subluxation identified. The
patient is status post previous posterior decompression and hardware
fusion of the lumbar spine.
IMPRESSION: 1. No acute findings.
2. Moderate to severe osteoarthritis, right greater than left.

## 2015-08-04 ENCOUNTER — Ambulatory Visit (INDEPENDENT_AMBULATORY_CARE_PROVIDER_SITE_OTHER)
Admission: EM | Admit: 2015-08-04 | Discharge: 2015-08-04 | Disposition: A | Discharge status: Nursing Home (Includes ICF) | Payer: Medicare Other | Source: Home / Self Care | Attending: Family Medicine | Admitting: Family Medicine

## 2015-08-04 ENCOUNTER — Emergency Department (HOSPITAL_COMMUNITY): Payer: Medicare Other

## 2015-08-04 ENCOUNTER — Emergency Department (HOSPITAL_COMMUNITY)
Admission: EM | Admit: 2015-08-04 | Discharge: 2015-08-04 | Disposition: A | Discharge status: Home / Self Care | Payer: Medicare Other | Attending: Emergency Medicine | Admitting: Emergency Medicine

## 2015-08-04 ENCOUNTER — Encounter (HOSPITAL_COMMUNITY): Payer: Self-pay | Admitting: Emergency Medicine

## 2015-08-04 DIAGNOSIS — I10 Essential (primary) hypertension: Secondary | ICD-10-CM | POA: Diagnosis not present

## 2015-08-04 DIAGNOSIS — E119 Type 2 diabetes mellitus without complications: Secondary | ICD-10-CM | POA: Insufficient documentation

## 2015-08-04 DIAGNOSIS — Z79899 Other long term (current) drug therapy: Secondary | ICD-10-CM | POA: Insufficient documentation

## 2015-08-04 DIAGNOSIS — Z87891 Personal history of nicotine dependence: Secondary | ICD-10-CM | POA: Insufficient documentation

## 2015-08-04 DIAGNOSIS — N390 Urinary tract infection, site not specified: Secondary | ICD-10-CM

## 2015-08-04 DIAGNOSIS — Z7982 Long term (current) use of aspirin: Secondary | ICD-10-CM | POA: Insufficient documentation

## 2015-08-04 DIAGNOSIS — R600 Localized edema: Secondary | ICD-10-CM | POA: Diagnosis not present

## 2015-08-04 DIAGNOSIS — J810 Acute pulmonary edema: Secondary | ICD-10-CM | POA: Diagnosis not present

## 2015-08-04 DIAGNOSIS — R0602 Shortness of breath: Secondary | ICD-10-CM | POA: Diagnosis present

## 2015-08-04 LAB — URINALYSIS, ROUTINE W REFLEX MICROSCOPIC
BILIRUBIN URINE: NEGATIVE
GLUCOSE, UA: NEGATIVE mg/dL
Ketones, ur: NEGATIVE mg/dL
Nitrite: NEGATIVE
PROTEIN: NEGATIVE mg/dL
SPECIFIC GRAVITY, URINE: 1.013 (ref 1.005–1.030)
pH: 6 (ref 5.0–8.0)

## 2015-08-04 LAB — POCT URINALYSIS DIP (DEVICE)
BILIRUBIN URINE: NEGATIVE
GLUCOSE, UA: NEGATIVE mg/dL
Ketones, ur: NEGATIVE mg/dL
NITRITE: NEGATIVE
Protein, ur: 100 mg/dL — AB
SPECIFIC GRAVITY, URINE: 1.02 (ref 1.005–1.030)
UROBILINOGEN UA: 0.2 mg/dL (ref 0.0–1.0)
pH: 6 (ref 5.0–8.0)

## 2015-08-04 LAB — CBC WITH DIFFERENTIAL/PLATELET
Basophils Absolute: 0 10*3/uL (ref 0.0–0.1)
Basophils Relative: 0 %
EOS ABS: 0.5 10*3/uL (ref 0.0–0.7)
EOS PCT: 10 %
HCT: 34.6 % — ABNORMAL LOW (ref 36.0–46.0)
Hemoglobin: 10.7 g/dL — ABNORMAL LOW (ref 12.0–15.0)
LYMPHS ABS: 2 10*3/uL (ref 0.7–4.0)
LYMPHS PCT: 39 %
MCH: 30.9 pg (ref 26.0–34.0)
MCHC: 30.9 g/dL (ref 30.0–36.0)
MCV: 100 fL (ref 78.0–100.0)
MONOS PCT: 9 %
Monocytes Absolute: 0.4 10*3/uL (ref 0.1–1.0)
Neutro Abs: 2.1 10*3/uL (ref 1.7–7.7)
Neutrophils Relative %: 42 %
PLATELETS: 256 10*3/uL (ref 150–400)
RBC: 3.46 MIL/uL — ABNORMAL LOW (ref 3.87–5.11)
RDW: 13.6 % (ref 11.5–15.5)
WBC: 5 10*3/uL (ref 4.0–10.5)

## 2015-08-04 LAB — COMPREHENSIVE METABOLIC PANEL
ALBUMIN: 3.3 g/dL — AB (ref 3.5–5.0)
ALT: 14 U/L (ref 14–54)
AST: 17 U/L (ref 15–41)
Alkaline Phosphatase: 66 U/L (ref 38–126)
Anion gap: 5 (ref 5–15)
BUN: 20 mg/dL (ref 6–20)
CHLORIDE: 108 mmol/L (ref 101–111)
CO2: 26 mmol/L (ref 22–32)
CREATININE: 1.33 mg/dL — AB (ref 0.44–1.00)
Calcium: 9.2 mg/dL (ref 8.9–10.3)
GFR calc Af Amer: 39 mL/min — ABNORMAL LOW (ref >60)
GFR, EST NON AFRICAN AMERICAN: 34 mL/min — AB (ref >60)
GLUCOSE: 195 mg/dL — AB (ref 65–99)
POTASSIUM: 4.4 mmol/L (ref 3.5–5.1)
Sodium: 139 mmol/L (ref 135–145)
Total Bilirubin: 0.3 mg/dL (ref 0.3–1.2)
Total Protein: 7.3 g/dL (ref 6.5–8.1)

## 2015-08-04 LAB — URINE MICROSCOPIC-ADD ON

## 2015-08-04 LAB — I-STAT CG4 LACTIC ACID, ED
LACTIC ACID, VENOUS: 1.13 mmol/L (ref 0.5–1.9)
LACTIC ACID, VENOUS: 0.9 mmol/L (ref 0.5–1.9)

## 2015-08-04 LAB — BRAIN NATRIURETIC PEPTIDE: B NATRIURETIC PEPTIDE 5: 120.8 pg/mL — AB (ref 0.0–100.0)

## 2015-08-04 MED ORDER — CEFTRIAXONE SODIUM 1 G IJ SOLR: 1.0000 g | Freq: Once | INTRAMUSCULAR | Status: DC

## 2015-08-04 MED ORDER — CEPHALEXIN 500 MG PO CAPS: 500.0000 mg | ORAL_CAPSULE | Freq: Four times a day (QID) | ORAL | Status: AC

## 2015-08-04 MED ORDER — CEPHALEXIN 250 MG PO CAPS
500.0000 mg | ORAL_CAPSULE | Freq: Once | ORAL | Status: AC
  Administered 2015-08-04: 500 mg via ORAL
  Filled 2015-08-04: qty 2

## 2015-08-04 NOTE — ED Notes (Signed)
Sent from urgent care for UTI -- pt has been sick for 3 weeks- pt lives with 2 of her daughters, son in [place] is [place]-- family states has complained of hurting with urination,

## 2015-08-04 NOTE — ED Provider Notes (Signed)
CSN: [id number]     Arrival date & time 08/04/15  1211 History   First MD Initiated Contact with Patient 08/04/15 1237     Chief Complaint  Patient presents with  . Back Pain  . Leg Swelling  . Dysuria   (Consider location/radiation/quality/duration/timing/severity/associated sxs/prior Treatment) HPI  Kathleen Weeks is a 82 y.o. female presenting to UC with granddaughters with concern for 3 weeks of gradually worsening pedal edema, intermittent c/o dysuria, and lower back pain.  Pt is hard of hearing, majority of hx provided by granddaughters. Granddaughters concerned for dehydration as pt seems more fatigued. Pt does use a walker at home to help with ambulation.  Pt has been refusing to come to medical provider as she states she feels fine.  Pt states she has been eating and drinking well. Denies pain at this time. Granddaughters insist she was c/o dysuria and low back pain last night. No vomiting or known fevers.  Pt has had intermittent dry cough for several weeks. No known hx of CAD or CHF.   Past Medical History  Diagnosis Date  . Hypertension   . Diabetes mellitus   . Arthritis   . Renal insufficiency    Past Surgical History  Procedure Laterality Date  . Back surgery     History reviewed. No pertinent family history. Social History  Substance Use Topics  . Smoking status: Former Games developer  . Smokeless tobacco: Never Used  . Alcohol Use: No   OB History    No data available     Review of Systems  Constitutional: Positive for fatigue. Negative for fever and chills.  Respiratory: Positive for cough. Negative for shortness of breath.   Cardiovascular: Positive for leg swelling ( bilateral). Negative for chest pain and palpitations.  Gastrointestinal: Negative for nausea, vomiting, abdominal pain and diarrhea.  Genitourinary: Positive for dysuria. Negative for frequency and hematuria.  Musculoskeletal: Positive for back pain. Negative for myalgias.  Neurological:  Positive for weakness ( generalized).    Allergies  Review of patient's allergies indicates no known allergies.  Home Medications   Prior to Admission medications   Medication Sig Start Date End Date Taking? Authorizing Provider  aspirin 81 MG chewable tablet Chew 1 tablet (81 mg total) by mouth daily. 05/07/13   Russella Dar, NP  cefUROXime (CEFTIN) 250 MG tablet Take 1 tablet (250 mg total) by mouth 2 (two) times daily with a meal. Patient not taking: Reported on 05/06/2014 05/07/13   Russella Dar, NP  ciprofloxacin-dexamethasone (CIPRODEX) otic suspension Place 4 drops into the right ear 2 (two) times daily. Patient not taking: Reported on 05/06/2014 05/07/13   Russella Dar, NP  dextromethorphan-guaiFENesin [facility] DM) 30-600 MG per 12 hr tablet Take 1 tablet by mouth 2 (two) times daily.    Historical Provider, MD  ENSURE (ENSURE) Take 237 mLs by mouth 3 (three) times daily between meals. 05/07/13   Russella Dar, NP  linagliptin (TRADJENTA) 5 MG TABS tablet Take 5 mg by mouth daily.    Historical Provider, MD  metoprolol tartrate (LOPRESSOR) 25 MG tablet Take 1 tablet (25 mg total) by mouth 2 (two) times daily. 05/07/13   Russella Dar, NP  naproxen sodium (ANAPROX) 220 MG tablet Take 220 mg by mouth 2 (two) times daily with a meal.    Historical Provider, MD  olmesartan (BENICAR) 40 MG tablet Take 1 tablet (40 mg total) by mouth daily. Patient not taking: Reported on 05/06/2014 05/07/13   Revonda Standard  Audry PiliL Ellis, NP  oxyCODONE (OXY IR/ROXICODONE) 5 MG immediate release tablet Take 1 tablet (5 mg total) by mouth every 4 (four) hours as needed for moderate pain. Patient not taking: Reported on 05/06/2014 05/07/13   Russella DarAllison L Ellis, NP  pneumococcal 23 valent vaccine (PNU-IMMUNE) 25 MCG/0.5ML injection Inject 0.5 mLs into the muscle once. GIVEN AT DISCHARGE Patient not taking: Reported on 06/01/2014 05/07/13   Russella DarAllison L Ellis, NP  predniSONE (DELTASONE) 20 MG tablet Take 2 tablets (40 mg total)  by mouth daily. 12/15/14   Tatyana Kirichenko, PA-C   Meds Ordered and Administered this Visit   Medications - No data to display  BP 161/84 mmHg  Pulse 88  Temp(Src) 98.7 F (37.1 C) (Oral)  Resp 16  SpO2 100% No data found.   Physical Exam  Constitutional: She appears well-developed and well-nourished. No distress.  Frail elderly female sitting in wheelchair in exam room. NAD  HENT:  Head: Normocephalic and atraumatic.  Mouth/Throat: Oropharynx is clear and moist.  Eyes: Conjunctivae are normal. No scleral icterus.  Neck: Normal range of motion. Neck supple.  Cardiovascular: Normal rate, regular rhythm and normal heart sounds.   Pulmonary/Chest: Effort normal and breath sounds normal. No respiratory distress. She has no wheezes. She has no rales.  Abdominal: Soft. Bowel sounds are normal. She exhibits no distension and no mass. There is no tenderness. There is no rebound, no guarding and no CVA tenderness.  Musculoskeletal: Normal range of motion. She exhibits edema.  Bilateral pedal edema, 1+, worse in feet than lower legs.  Neurological: She is alert.  Skin: Skin is warm and dry. No rash noted. She is not diaphoretic. No erythema.  Nursing note and vitals reviewed.   ED Course  Procedures (including critical care time)  Labs Review Labs Reviewed  POCT URINALYSIS DIP (DEVICE) - Abnormal; Notable for the following:    Hgb urine dipstick SMALL (*)    Protein, ur 100 (*)    Leukocytes, UA MODERATE (*)    All other components within normal limits  URINE CULTURE    Imaging Review No results found.   MDM   1. UTI (lower urinary tract infection)   2. Pedal edema    Pt found to have UTI, however, CXR was ordered as well due to reports of cough for several weeks and pedal edema.  Pt too weak to stand for CXR.  Spoke with Dr. Artis FlockKindl and granddaughters, will transfer pt to ED for further evaluation and treatment.     [name], PA-C 08/04/15 1348

## 2015-08-04 NOTE — ED Notes (Signed)
E-sig not working, pt family verbalized understanding DC instructions.

## 2015-08-04 NOTE — ED Notes (Signed)
Pt has hx of dementia -- has gotten more confused recently

## 2015-08-04 NOTE — ED Notes (Signed)
[facility] with a complaint of dysuria, lower back pain and lower extremity edema x 3 weeks.

## 2015-08-04 NOTE — ED Notes (Signed)
Pt's family stating that they want to leave, states pt is asking if she needs to go to [facility] [place]. Determinied that everyone just wants to go home.

## 2015-08-04 NOTE — ED Provider Notes (Signed)
CSN: [id number]     Arrival date & time 08/04/15  1354 History   First MD Initiated Contact with Patient 08/04/15 1621     Chief Complaint  Patient presents with  . transfer from urgent care   . Urinary Tract Infection     (Consider location/radiation/quality/duration/timing/severity/associated sxs/prior Treatment) Patient is a 82 y.o. female presenting with shortness of breath. The history is provided by the patient. No language interpreter was used.  Shortness of Breath Severity:  Mild Onset quality:  Gradual Duration:  3 days Timing:  Constant Progression:  Worsening Chronicity:  New Context: not URI   Relieved by:  Nothing Worsened by:  Nothing tried Ineffective treatments:  None tried Associated symptoms: no sore throat   Risk factors: no hx of PE/DVT     Past Medical History  Diagnosis Date  . Hypertension   . Diabetes mellitus   . Arthritis   . Renal insufficiency    Past Surgical History  Procedure Laterality Date  . Back surgery     No family history on file. Social History  Substance Use Topics  . Smoking status: Former Games developer  . Smokeless tobacco: Never Used  . Alcohol Use: No   OB History    No data available     Review of Systems  HENT: Negative for sore throat.   Respiratory: Positive for shortness of breath.   All other systems reviewed and are negative.     Allergies  Review of patient's allergies indicates no known allergies.  Home Medications   Prior to Admission medications   Medication Sig Start Date End Date Taking? Authorizing Provider  aspirin 81 MG chewable tablet Chew 1 tablet (81 mg total) by mouth daily. 05/07/13   Russella Dar, NP  cefUROXime (CEFTIN) 250 MG tablet Take 1 tablet (250 mg total) by mouth 2 (two) times daily with a meal. Patient not taking: Reported on 05/06/2014 05/07/13   Russella Dar, NP  ciprofloxacin-dexamethasone (CIPRODEX) otic suspension Place 4 drops into the right ear 2 (two) times  daily. Patient not taking: Reported on 05/06/2014 05/07/13   Russella Dar, NP  dextromethorphan-guaiFENesin [facility] DM) 30-600 MG per 12 hr tablet Take 1 tablet by mouth 2 (two) times daily.    Historical Provider, MD  ENSURE (ENSURE) Take 237 mLs by mouth 3 (three) times daily between meals. 05/07/13   Russella Dar, NP  linagliptin (TRADJENTA) 5 MG TABS tablet Take 5 mg by mouth daily.    Historical Provider, MD  metoprolol tartrate (LOPRESSOR) 25 MG tablet Take 1 tablet (25 mg total) by mouth 2 (two) times daily. 05/07/13   Russella Dar, NP  naproxen sodium (ANAPROX) 220 MG tablet Take 220 mg by mouth 2 (two) times daily with a meal.    Historical Provider, MD  olmesartan (BENICAR) 40 MG tablet Take 1 tablet (40 mg total) by mouth daily. Patient not taking: Reported on 05/06/2014 05/07/13   Russella Dar, NP  oxyCODONE (OXY IR/ROXICODONE) 5 MG immediate release tablet Take 1 tablet (5 mg total) by mouth every 4 (four) hours as needed for moderate pain. Patient not taking: Reported on 05/06/2014 05/07/13   Russella Dar, NP  pneumococcal 23 valent vaccine (PNU-IMMUNE) 25 MCG/0.5ML injection Inject 0.5 mLs into the muscle once. GIVEN AT DISCHARGE Patient not taking: Reported on 06/01/2014 05/07/13   Russella Dar, NP  predniSONE (DELTASONE) 20 MG tablet Take 2 tablets (40 mg total) by mouth daily. 12/15/14   Lemont Fillers  Kirichenko, PA-C   BP 180/75 mmHg  Pulse 73  Temp(Src) 98.8 F (37.1 C) (Oral)  Resp 16  SpO2 100% Physical Exam  Constitutional: She is oriented to person, place, and time. She appears well-developed and well-nourished.  HENT:  Head: Normocephalic and atraumatic.  Left Ear: External ear normal.  Nose: Nose normal.  Mouth/Throat: Oropharynx is clear and moist.  Eyes: EOM are normal. Pupils are equal, round, and reactive to light.  Neck: Normal range of motion.  Cardiovascular: Normal rate and normal heart sounds.   Pulmonary/Chest: Effort normal.  Abdominal: She  exhibits no distension.  Musculoskeletal: Normal range of motion.  Neurological: She is alert and oriented to person, place, and time.  Skin: Skin is warm.  Psychiatric: She has a normal mood and affect.  Nursing note and vitals reviewed.   ED Course  Procedures (including critical care time) Labs Review Labs Reviewed  COMPREHENSIVE METABOLIC PANEL - Abnormal; Notable for the following:    Glucose, Bld 195 (*)    Creatinine, Ser 1.33 (*)    Albumin 3.3 (*)    GFR calc non Af Amer 34 (*)    GFR calc Af Amer 39 (*)    All other components within normal limits  CBC WITH DIFFERENTIAL/PLATELET - Abnormal; Notable for the following:    RBC 3.46 (*)    Hemoglobin 10.7 (*)    HCT 34.6 (*)    All other components within normal limits  URINE CULTURE  URINALYSIS, ROUTINE W REFLEX MICROSCOPIC (NOT AT [facility])  I-STAT CG4 LACTIC ACID, ED    Imaging Review Dg Chest 2 View  08/04/2015  CLINICAL DATA:  Cough.  Hypertension.  Diabetes. EXAM: CHEST  2 VIEW COMPARISON:  12/15/2014 FINDINGS: Prominence of the ascending thoracic aorta. Atherosclerotic calcification of the aortic arch. Kerley B-lines noted in the lung bases. Mild cardiomegaly, cardiothoracic index 54%. Lumbar fixation hardware noted. Bony demineralization. No pleural effusion. Thoracic spondylosis. IMPRESSION: 1. Mild cardiomegaly with Kerley B-lines suggesting faint interstitial edema. 2. Atherosclerotic aortic arch. Mild prominence of the ascending thoracic aortic contour could be from tortuosity or a small aneurysm. 3. Bony demineralization. Electronically Signed   By: Gaylyn RongWalter  Liebkemann M.D.   On: 08/04/2015 15:07   I have personally reviewed and evaluated these images and lab results as part of my medical decision-making.   EKG Interpretation None      MDM Pt does not want to stay.  Pt and family wants to go home.  Urgent care concerned that pt could not walk. Pt able to walk to bathroom unassisted with her cane.  I counseld pt  and family on possible edema.  Urine shows 6-30 wbc's.   I will treat with keflex.   Pt advised to see her primary for recheck of possible edema and uti  Urine culture pending   Final diagnoses:  UTI (lower urinary tract infection)  Acute pulmonary edema (HCC)   Meds ordered this encounter  Medications  . cephALEXin (KEFLEX) 500 MG capsule    Sig: Take 1 capsule (500 mg total) by mouth 4 (four) times daily.    Dispense:  40 capsule    Refill:  0    Order Specific Question:  Supervising Provider    Answer:  MILLER, BRIAN [[id number]]  . cephALEXin (KEFLEX) capsule 500 mg    Sig:   An After Visit Summary was printed and given to the patient.    Lonia SkinnerLeslie K Oak IslandSofia, PA-C 08/04/15 1947  Leta BaptistEmily Roe Nguyen, MD 08/15/15 [phone]

## 2015-08-04 NOTE — Discharge Instructions (Signed)
Pulmonary Edema °Pulmonary edema (PE) is a condition in which fluid collects in the lungs. This makes it hard to breathe. PE may be a result of the heart not pumping very well or a result of injury.  °CAUSES  °· Coronary artery disease causes blockages in the arteries of the heart. This deprives the heart muscle of oxygen and weakens the muscle. A heart attack is a form of coronary artery disease. °· High blood pressure causes the heart muscle to work harder than usual. Over time, the heart muscle may get stiff, and it starts to work less efficiently. It may also fatigue and weaken. °· Viral infection of the heart (myocarditis) may weaken the heart muscle. °· Metabolic conditions such as thyroid disease, excessive alcohol use, certain vitamin deficiencies, or diabetes may also weaken the heart muscle. °· Leaky or stiff heart valves may impair normal heart function. °· Lung disease may strain the heart muscle. °· Excessive demands on the heart such as too much salt or fluid intake. °· Failure to take prescribed medicines. °· Lung injury from heat or toxins, such as poisonous gas. °· Infection in the lungs or other parts of the body. °· Fluid overload caused by kidney failure or medicines. °SYMPTOMS  °· Shortness of breath at rest or with exertion. °· Grunting, wheezing, or gurgling while breathing. °· Feeling like you cannot get enough air. °· Breaths are shallow and fast. °· A lot of coughing with frothy or bloody mucus. °· Skin may become cool, damp, and turn a pale or bluish color. °DIAGNOSIS  °Initial diagnosis may be based on your history, symptoms, and a physical examination. Additional tests for PE may include: °· Electrocardiography. °· Chest X-ray. °· Blood tests. °· Stress test. °· Ultrasound evaluation of the heart (echocardiography). °· Evaluation by a heart doctor (cardiologist). °· Test of the heart arteries to look for blockages (angiography). °· Check of blood oxygen. °TREATMENT  °Treatment of PE will  depend on the underlying cause and will focus first on relieving the symptoms.  °· Extra oxygen to make breathing easier and assist with removing mucus. This may include breathing treatments or a tube into the lungs and a breathing machine. °· Medicine to help the body get rid of extra water, usually through an IV tube. °· Medicine to help the heart pump better. °· If poor heart function is the cause, treatment may include: °¨ Procedures to open blocked arteries, repair damaged heart valves, or remove some of the damaged heart muscle. °¨ A pacemaker to help the heart pump with less effort. °HOME CARE INSTRUCTIONS  °· Your health care provider will help you determine what type of exercise program may be helpful. It is important to maintain strength and increase it if possible. Pace your activities to avoid shortness of breath or chest pain. Rest for at least 1 hour before and after meals. Cardiac rehabilitation programs are available in some locations. °· Eat a heart-healthy diet low in salt, saturated fat, and cholesterol. Ask for help with choices. °· Make a list of every medicine, vitamin, or herbal supplement you are taking. Keep the list with you at all times. Show it to your health care provider at every visit and before starting a new medicine. Keep the list up to date. °· Ask your health care provider or pharmacist to help you write a plan or schedule so that you know things about each medicine such as: °¨ Why you are taking it. °¨ The possible side effects. °¨   The best time of day to take it.  Foods to take with it or avoid.  When to stop taking it.  Record your hospital or clinic weight. When you get home, compare it to your scale and record your weight. Then, weigh yourself first thing in the morning daily, and record the weights. You should weigh yourself every morning after you urinate and before you eat breakfast. Wear the same amount of clothing each time you weigh yourself. Provide your health  care provider with your weight record. Daily weights are important in the early recognition of excess fluid. Tell your health care provider right away if you have gained 03 lb/1.4 kg in 1 day, 05 lb/2.3 kg in a week, or as directed by your health care provider. Your medicines may need to be adjusted.  Blood pressure monitoring should be done as often as directed. You can get a home blood pressure cuff at your drugstore. Record these values and bring them with you for your clinic visits. Notify your health care provider if you become dizzy or light-headed when standing up.  If you are currently a smoker, it is time to quit. Nicotine makes your heart work harder and is one of the leading causes of cardiac deaths. Do not use nicotine gum or patches before talking to your doctor.  Make a follow-up appointment with your health care provider as directed.  Ask your health care provider for a copy of your latest heart tracing (ECG) and keep a copy with you at all times. SEEK IMMEDIATE MEDICAL CARE IF:   You have severe chest pain, especially if the pain is crushing or pressure-like and spreads to the arms, back, neck, or jaw. THIS IS AN EMERGENCY. Do not wait to see if the pain will go away. Call for local emergency medical help. Do not drive yourself to the hospital.  You have sweating, feel sick to your stomach (nauseous), or are experiencing shortness of breath.  Your weight increases by 03 lb/1.4 kg in 1 day or 05 lb/2.3 kg in a week.  You notice increasing shortness of breath that is unusual for you. This may happen during rest, sleep, or with activity.  You develop chest pain (angina) or pain that is unusual for you.  You notice more swelling in your hands, feet, ankles, or abdomen.  You notice lasting (persistent) dizziness, blurred vision, headache, or unsteadiness.  You begin to cough up bloody mucus (sputum).  You are unable to sleep because it is hard to breathe.  You begin to feel a  "jumping" or "fluttering" sensation (palpitations) in the chest that is unusual for you. MAKE SURE YOU:  Understand these instructions.  Will watch your condition.  Will get help right away if you are not doing well or get worse.   This information is not intended to replace advice given to you by your health care provider. Make sure you discuss any questions you have with your health care provider.   Document Released: 03/31/2002 Document Revised: 01/13/2013 Document Reviewed: 09/15/2012 Elsevier Interactive Patient Education 2016 Elsevier Inc. Urinary Tract Infection Urinary tract infections (UTIs) can develop anywhere along your urinary tract. Your urinary tract is your body's drainage system for removing wastes and extra water. Your urinary tract includes two kidneys, two ureters, a bladder, and a urethra. Your kidneys are a pair of bean-shaped organs. Each kidney is about the size of your fist. They are located below your ribs, one on each side of your spine. CAUSES  Infections are caused by microbes, which are microscopic organisms, including fungi, viruses, and bacteria. These organisms are so small that they can only be seen through a microscope. Bacteria are the microbes that most commonly cause UTIs. SYMPTOMS  Symptoms of UTIs may vary by age and gender of the patient and by the location of the infection. Symptoms in young women typically include a frequent and intense urge to urinate and a painful, burning feeling in the bladder or urethra during urination. Older women and men are more likely to be tired, shaky, and weak and have muscle aches and abdominal pain. A fever may mean the infection is in your kidneys. Other symptoms of a kidney infection include pain in your back or sides below the ribs, nausea, and vomiting. DIAGNOSIS To diagnose a UTI, your caregiver will ask you about your symptoms. Your caregiver will also ask you to provide a urine sample. The urine sample will be  tested for bacteria and white blood cells. White blood cells are made by your body to help fight infection. TREATMENT  Typically, UTIs can be treated with medication. Because most UTIs are caused by a bacterial infection, they usually can be treated with the use of antibiotics. The choice of antibiotic and length of treatment depend on your symptoms and the type of bacteria causing your infection. HOME CARE INSTRUCTIONS  If you were prescribed antibiotics, take them exactly as your caregiver instructs you. Finish the medication even if you feel better after you have only taken some of the medication.  Drink enough water and fluids to keep your urine clear or pale yellow.  Avoid caffeine, tea, and carbonated beverages. They tend to irritate your bladder.  Empty your bladder often. Avoid holding urine for long periods of time.  Empty your bladder before and after sexual intercourse.  After a bowel movement, women should cleanse from front to back. Use each tissue only once. SEEK MEDICAL CARE IF:   You have back pain.  You develop a fever.  Your symptoms do not begin to resolve within 3 days. SEEK IMMEDIATE MEDICAL CARE IF:   You have severe back pain or lower abdominal pain.  You develop chills.  You have nausea or vomiting.  You have continued burning or discomfort with urination. MAKE SURE YOU:   Understand these instructions.  Will watch your condition.  Will get help right away if you are not doing well or get worse.   This information is not intended to replace advice given to you by your health care provider. Make sure you discuss any questions you have with your health care provider.   Document Released: 10/18/2004 Document Revised: 09/29/2014 Document Reviewed: 02/16/2011 Elsevier Interactive Patient Education [employer] Elsevier Inc.

## 2015-08-06 LAB — URINE CULTURE

## 2015-08-07 ENCOUNTER — Telehealth (HOSPITAL_BASED_OUTPATIENT_CLINIC_OR_DEPARTMENT_OTHER): Payer: Self-pay

## 2015-08-07 NOTE — Telephone Encounter (Signed)
Post ED Visit - Positive Culture Follow-up  Culture report reviewed by antimicrobial stewardship pharmacist:  []  Enzo BiNathan Batchelder, Pharm.D. []  Celedonio MiyamotoJeremy Frens, Pharm.D., BCPS []  Garvin FilaMike Maccia, Pharm.D. []  Georgina PillionElizabeth Martin, Pharm.D., BCPS []  Double SpringMinh Pham, [address]Ph[address]D., BCPS, AAHIVP []  Estella HuskMichelle Turner, Pharm.D., BCPS, AAHIVP []  Tennis Mustassie Stewart, Pharm.D. []  Sherle Poeob Vincent, [address]Ph[address] Tressia Minersachel Ramsarg Pharm D Positive urine culture Treated with Cephalexin, organism sensitive to the same and no further patient follow-up is required at this time.  Jerry CarasCullom, [address] Burnett 08/07/2015, 11:24 AM

## 2018-01-07 ENCOUNTER — Other Ambulatory Visit: Payer: Self-pay | Admitting: Nurse Practitioner

## 2018-02-07 ENCOUNTER — Other Ambulatory Visit: Payer: Self-pay | Admitting: Nurse Practitioner

## 2018-03-19 ENCOUNTER — Other Ambulatory Visit: Payer: Self-pay | Admitting: Nurse Practitioner

## 2018-04-09 ENCOUNTER — Ambulatory Visit: Payer: Self-pay | Admitting: Internal Medicine

## 2018-04-22 ENCOUNTER — Other Ambulatory Visit: Payer: Self-pay | Admitting: Nurse Practitioner

## 2018-05-02 ENCOUNTER — Other Ambulatory Visit: Payer: Self-pay | Admitting: Internal Medicine

## 2018-05-02 DIAGNOSIS — E1169 Type 2 diabetes mellitus with other specified complication: Secondary | ICD-10-CM

## 2018-05-02 DIAGNOSIS — I2510 Atherosclerotic heart disease of native coronary artery without angina pectoris: Secondary | ICD-10-CM

## 2018-05-02 DIAGNOSIS — I10 Essential (primary) hypertension: Secondary | ICD-10-CM

## 2018-05-02 MED ORDER — AMOXICILLIN 250 MG PO CAPS: 250.0000 mg | ORAL_CAPSULE | Freq: Two times a day (BID) | ORAL | 0 refills | Status: AC

## 2018-05-06 ENCOUNTER — Other Ambulatory Visit: Payer: Self-pay | Admitting: Internal Medicine

## 2018-05-06 DIAGNOSIS — E1169 Type 2 diabetes mellitus with other specified complication: Secondary | ICD-10-CM

## 2018-05-06 DIAGNOSIS — I10 Essential (primary) hypertension: Secondary | ICD-10-CM

## 2018-05-06 DIAGNOSIS — I2510 Atherosclerotic heart disease of native coronary artery without angina pectoris: Secondary | ICD-10-CM

## 2018-05-07 ENCOUNTER — Other Ambulatory Visit: Payer: Self-pay | Admitting: Internal Medicine

## 2018-05-07 ENCOUNTER — Ambulatory Visit: Payer: Self-pay

## 2018-05-07 DIAGNOSIS — I10 Essential (primary) hypertension: Secondary | ICD-10-CM

## 2018-05-07 DIAGNOSIS — R5381 Other malaise: Secondary | ICD-10-CM

## 2018-05-07 DIAGNOSIS — E1169 Type 2 diabetes mellitus with other specified complication: Secondary | ICD-10-CM

## 2018-05-07 DIAGNOSIS — I2510 Atherosclerotic heart disease of native coronary artery without angina pectoris: Secondary | ICD-10-CM

## 2018-05-07 NOTE — Chronic Care Management (AMB) (Signed)
   Chronic Care Management   Initial Visit Note  05/07/2018 Name: Kathleen Weeks MRN: [id number] DOB: 01/28/1922  Referred by: Dorothyann Peng, MD Reason for referral : Care Coordination   Kathleen Weeks is a 85 y.o. year old female who is a primary care patient of Dorothyann Peng, MD. The CCM team was consulted for assistance with chronic disease management and care coordination needs.   Review of patient status, including review of consultants reports, relevant laboratory and other test results, and collaboration with appropriate care team members and the patient's provider was performed as part of comprehensive patient evaluation and provision of chronic care management services.    I initiated and established the plan of care for Kathleen Weeks during one on one collaboration with my clinical care management colleague Bevelyn Ngo BSW who is also engaged with this patient to address social work needs.   Goals Addressed    . Assist with Care Coordination and Disease Management       Current Barriers:  [name] Knowledge Barriers related to resources and support available to address needs related to Care Coordination and Disease Management  Case Manager Clinical Goal(s):  [name] Over the next 30 days, patient will work with CCM team to address needs related to disease management for recurrent UTI and community resources.   Interventions:  . Collaborated with BSW and initiated plan of care to address needs related to community resources and Palliative Care referral   Patient Self Care Activities:  . Currently UNABLE TO independently perform self care  Initial goal documentation        RNCM will reach out to the patient over the next 2 days.   [name], RN,CCM Care Management Coordinator [facility] Care Management/[facility]  Direct Phone: [phone]

## 2018-05-07 NOTE — Patient Instructions (Signed)
Social Worker Visit Information  Goals we discussed today:  Goals Addressed            This Visit's Progress     Patient Stated   . "she needs a light weight wheel chair" (pt-stated)       Son stated Current Barriers:  . Financial constraints . ADL IADL limitations . Limited education about how to obtain DME equipment  Clinical Social Work Clinical Goal(s):  [name] Over the next 30 days, client will work with SW to address concerns related to patient DME needs  Interventions: . Educated the patients son on the process of obtaining DME equipment . Provided feedback of the possibility of being billed a monthly co-pay pending insurance coverage . Advised the patients son that if the patient were to qualify for Medicaid she may be able to receive a new light-weight chair at no cost . Provided the patients son a choice on obtaining an order now or waiting Medicaid determination - the son wishes to pursue a new wheelchair now . Collaborated with the patients primary care provider regarding patient stated goal  Patient Self Care Activities:  . Currently UNABLE TO independently ambulate  Initial goal documentation     . "we think she needs more oversight in the home" (pt-stated)       Son stated Current Barriers:  . ADL IADL limitations . Inability to perform ADL's independently . Inability to perform IADL's independently  . Limited knowledge about Palliative Care  Clinical Social Work Clinical Goal(s):  [name] Over the next 10 days the patient will be referred to a Palliatve Care provider to assist with goals of care and home management of the patients chronic conditions . Over the next 30 days the patient will engage with a Palliative Care provider  Interventions: . Patient's son interviewed and appropriate assessments performed . Discussed plans with patient's son for ongoing care management follow up and provided son with direct contact information for care management  team . Collaborated with primary care provider re: palliative care referral . Provided education to patient/caregiver about Hospice and/or Palliative Care services  Patient Self Care Activities:  . Self administers medications as prescribed . Calls pharmacy for medication refills . Calls provider office for new concerns or questions  Initial goal documentation     . "we want to know more about her benefits" (pt-stated)       Son stated  Current Barriers:  . Limited knowledge about the patients current Medicare Advantage plan  Clinical Social Work Clinical Goal(s):  [name] Over the next 30 days the patients son and POA will be able to demonstrate knowledge of the patients benefits.  Interventions: . POA interviewed and appropriate assessments performed . Provided POA with information about medicare advantage plan benefits including transportation, over the counter benefits and emergency response systems . Discussed plans with POA for ongoing care management follow up and provided patient with direct contact information for care management team  Patient Self Care Activities:  . Currently unable to verbalize payor benefits  Initial goal documentation     . "we would like help applying for assistance programs" (pt-stated)       POA stated:  Current Barriers:  . Financial constraints . Limited access to food . Limited education about patient assistance programs  Clinical Social Work Clinical Goal(s):  [name] Over the next 20 days the patients son and POA will complete and submit a Medicaid application on behalf of the patient . Over the next  20 days the patients son and POA will complete and submit a food stamp application on behalf of the patient . Over the next 30 day the patients son and POA will complete and submit a property tax relief application on behalf of the patient  Interventions: . Patient's son interviewed and appropriate assessments performed . Discussed plans with  patient's son for ongoing care management follow up and provided son with direct contact information for care management team . Educated the patients son on the [place] property tax relief program for the elderly  . Advised the patients son to complete a Medicaid application in hopes of qualifying the patient for benefits including an in home caregiver . Advised the patients son to complete and submit a food and nutrition application in efforts to obtain food stamps for the patient based on reported food insecurities . Provided the patients son with application via secure e-mail correspondence . Informed the patients son of the ability to submit application via Internet if desired . Encouraged the patients son to complete and submit all applications in a timely manner  Patient Self Care Activities:  . Currently unable to express knowledge of assistance programs in which the patient qualifies  Initial goal documentation       Other   . "My mom needs a working Engineer, water       Son stated Current Barriers:  . Financial constraints . Lacks knowledge of community resource: to assist with home modifications  Clinical Social Work Clinical Goal(s):  [name] Over the next 60 days, patient will work with SW to address concerns related to home modification needs.  Interventions: . Patients son interviewed and appropriate assessments completed . Educated the patients son on community resource programs to assist with home modification needs . Provided the patients son with a brochure for [employer] . Placed a referral to [employer] via [employer] . Advised the patients son he would receive a call in the coming days from [employer] to complete an intake screening in order to determine if the patient would qualify for this program  Patient Self Care Activities:  . Currently unable to address home repair needs  Initial goal  documentation     . Assist with Care Coordination and Disease Management       Current Barriers:  [name] Knowledge Barriers related to resources and support available to address needs related to Care Coordination and Disease Management  Case Manager Clinical Goal(s):  [name] Over the next 30 days, patient will work with CCM team to address needs related to disease management for recurrent UTI and community resources.   Interventions:  . Collaborated with BSW and initiated plan of care to address needs related to community resources and Palliative Care referral   Patient Self Care Activities:  . Currently UNABLE TO independently perform self care  Initial goal documentation        Materials provided: Yes: Applications for community resources  Ms. Mahlum was given information about Chronic Care Management services today including:  1. CCM service includes personalized support from designated clinical staff supervised by her physician, including individualized plan of care and coordination with other care providers 2. 24/7 contact phone numbers for assistance for urgent and routine care needs. 3. Service will only be billed when office clinical staff spend 20 minutes or more in a month to coordinate care. 4. Only one practitioner may furnish and bill the service in a calendar month. 5. The patient  may stop CCM services at any time (effective at the end of the month) by phone call to the office staff. 6. The patient will be responsible for cost sharing (co-pay) of up to 20% of the service fee (after annual deductible is met).  Patient agreed to services and verbal consent obtained.   The patient verbalized understanding of instructions provided today and declined a print copy of patient instruction materials.   Follow up plan: SW will follow up with the patients family over the next 7-10 days   Daneen Schick, BSW, CDP [facility] / [place] [facility] Social Worker [phone]

## 2018-05-07 NOTE — Chronic Care Management (AMB) (Signed)
Chronic Care Management    Clinical Social Work General Note  05/07/2018 Name: Kathleen Weeks MRN: [id number] DOB: 1923/01/11  Kathleen Weeks is a 85 y.o. year old female who is a primary care patient of [name], MD. The CCM was consulted to assist the patient with [employer].   I placed an outreach call to the patients home number on today's date. I spoke with the patients son Rodney Langton and daughter-in-law Wallis Bamberg who both are reported to be the patients POA.   Mr. Landry Mellow was given information about Chronic Care Management services as it would relate to his mother today including:  1. CCM service includes personalized support from designated clinical staff supervised by her physician, including individualized plan of care and coordination with other care providers 2. 24/7 contact phone numbers for assistance for urgent and routine care needs. 3. Service will only be billed when office clinical staff spend 20 minutes or more in a month to coordinate care. 4. Only one practitioner may furnish and bill the service in a calendar month. 5. The patient may stop CCM services at any time (effective at the end of the month) by phone call to the office staff. 6. The patient will be responsible for cost sharing (co-pay) of up to 20% of the service fee (after annual deductible is met).  Patient agreed to services and verbal consent obtained.   Review of patient status, including review of consultants reports, relevant laboratory and other test results, and collaboration with appropriate care team members and the patient's provider was performed as part of comprehensive patient evaluation and provision of chronic care management services.    SDOH (Social Determinants of Health) screening performed today. See Care Plan Entry related to challenges with: Housing  Financial Strain  Food Insecurity   Goals Addressed            This Visit's Progress     Patient Stated   . "she  needs a light weight wheel chair" (pt-stated)       Son stated Current Barriers:  . Financial constraints . ADL IADL limitations . Limited education about how to obtain DME equipment  Clinical Social Work Clinical Goal(s):  [name] Over the next 30 days, client will work with SW to address concerns related to patient DME needs  Interventions: . Educated the patients son on the process of obtaining DME equipment . Provided feedback of the possibility of being billed a monthly co-pay pending insurance coverage . Advised the patients son that if the patient were to qualify for Medicaid she may be able to receive a new light-weight chair at no cost . Provided the patients son a choice on obtaining an order now or waiting Medicaid determination - the son wishes to pursue a new wheelchair now . Collaborated with the patients primary care provider regarding patient stated goal  Patient Self Care Activities:  . Currently UNABLE TO independently ambulate  Initial goal documentation     . "we think she needs more oversight in the home" (pt-stated)       Son stated Current Barriers:  . ADL IADL limitations . Inability to perform ADL's independently . Inability to perform IADL's independently  . Limited knowledge about Palliative Care  Clinical Social Work Clinical Goal(s):  [name] Over the next 10 days the patient will be referred to a Palliatve Care provider to assist with goals of care and home management of the patients chronic conditions . Over the next 30 days the  patient will engage with a Palliative Care provider  Interventions: . Patient's son interviewed and appropriate assessments performed . Discussed plans with patient's son for ongoing care management follow up and provided son with direct contact information for care management team . Collaborated with primary care provider re: palliative care referral . Provided education to patient/caregiver about Hospice and/or Palliative Care  services  Patient Self Care Activities:  . Self administers medications as prescribed . Calls pharmacy for medication refills . Calls provider office for new concerns or questions  Initial goal documentation     . "we want to know more about her benefits" (pt-stated)       Son stated  Current Barriers:  . Limited knowledge about the patients current Medicare Advantage plan  Clinical Social Work Clinical Goal(s):  [name] Over the next 30 days the patients son and POA will be able to demonstrate knowledge of the patients benefits.  Interventions: . POA interviewed and appropriate assessments performed . Provided POA with information about medicare advantage plan benefits including transportation, over the counter benefits and emergency response systems . Discussed plans with POA for ongoing care management follow up and provided patient with direct contact information for care management team  Patient Self Care Activities:  . Currently unable to verbalize payor benefits  Initial goal documentation     . "we would like help applying for assistance programs" (pt-stated)       POA stated:  Current Barriers:  . Financial constraints . Limited access to food . Limited education about patient assistance programs  Clinical Social Work Clinical Goal(s):  [name] Over the next 20 days the patients son and POA will complete and submit a Medicaid application on behalf of the patient . Over the next 20 days the patients son and POA will complete and submit a food stamp application on behalf of the patient . Over the next 30 day the patients son and POA will complete and submit a property tax relief application on behalf of the patient  Interventions: . Patient's son interviewed and appropriate assessments performed . Discussed plans with patient's son for ongoing care management follow up and provided son with direct contact information for care management team . Educated the patients son on the  [place] property tax relief program for the elderly  . Advised the patients son to complete a Medicaid application in hopes of qualifying the patient for benefits including an in home caregiver . Advised the patients son to complete and submit a food and nutrition application in efforts to obtain food stamps for the patient based on reported food insecurities . Provided the patients son with application via secure e-mail correspondence . Informed the patients son of the ability to submit application via Internet if desired . Encouraged the patients son to complete and submit all applications in a timely manner  Patient Self Care Activities:  . Currently unable to express knowledge of assistance programs in which the patient qualifies  Initial goal documentation       Other   . "My mom needs a working Engineer, water       Son stated Current Barriers:  . Financial constraints . Lacks knowledge of community resource: to assist with home modifications  Clinical Social Work Clinical Goal(s):  [name] Over the next 60 days, patient will work with SW to address concerns related to home modification needs.  Interventions: . Patients son interviewed and appropriate assessments completed . Educated the patients son on [employer]  to assist with home modification needs . Provided the patients son with a brochure for [employer] . Placed a referral to [employer] via [employer] . Advised the patients son he would receive a call in the coming days from [employer] to complete an intake screening in order to determine if the patient would qualify for this program  Patient Self Care Activities:  . Currently unable to address home repair needs  Initial goal documentation                    Follow Up Plan: SW will follow up with the patients family over the next 7-10 days.       Daneen Schick, BSW, CDP [facility] / [facility]  Care Management Social Worker [phone]  Total time spent performing care coordination and/or care management activities with the patient by phone or face to face = 65 minutes.

## 2018-05-08 ENCOUNTER — Other Ambulatory Visit: Payer: Self-pay

## 2018-05-08 ENCOUNTER — Telehealth: Payer: Self-pay

## 2018-05-08 ENCOUNTER — Ambulatory Visit (INDEPENDENT_AMBULATORY_CARE_PROVIDER_SITE_OTHER): Payer: Medicare Other

## 2018-05-08 DIAGNOSIS — E1169 Type 2 diabetes mellitus with other specified complication: Secondary | ICD-10-CM | POA: Diagnosis not present

## 2018-05-08 DIAGNOSIS — I10 Essential (primary) hypertension: Secondary | ICD-10-CM

## 2018-05-09 ENCOUNTER — Ambulatory Visit: Payer: Self-pay

## 2018-05-09 DIAGNOSIS — I10 Essential (primary) hypertension: Secondary | ICD-10-CM

## 2018-05-09 DIAGNOSIS — I2510 Atherosclerotic heart disease of native coronary artery without angina pectoris: Secondary | ICD-10-CM

## 2018-05-09 DIAGNOSIS — E1169 Type 2 diabetes mellitus with other specified complication: Secondary | ICD-10-CM

## 2018-05-09 NOTE — Chronic Care Management (AMB) (Signed)
Chronic Care Management    Clinical Social Work Follow Up Note  05/09/2018 Name: Kathleen Weeks MRN: [id number] DOB: 03/06/1922  Kathleen Weeks is a 85 y.o. year old female who is a primary care patient of Dorothyann PengSanders, Robyn, MD. The CCM team was consulted for assistance with care coordination.   Return call received from the patients POA Kathleen Weeks. SW assisted Kathleen Weeks in contacting [employer] to order a life alert system. The patient will receive an in home alert system with fall detection in the next 14-21 days.   Goals Addressed            This Visit's Progress     Patient Stated   . "we think she needs more oversight in the home" (pt-stated)   On track    Son stated Current Barriers:  . ADL IADL limitations . Inability to perform ADL's independently . Inability to perform IADL's independently  . Limited knowledge about Palliative Care  Clinical Social Work Clinical Goal(s):  [name]. Over the next 10 days the patient will be referred to a Palliatve Care provider to assist with goals of care and home management of the patients chronic conditions . Over the next 30 days the patient will engage with a Palliative Care provider  Interventions: . Confirmed patient referral sent to Palliative Care ([facility]) . Communicated with patient POA status of referral  Patient Self Care Activities:  . Self administers medications as prescribed . Calls pharmacy for medication refills . Calls provider office for new concerns or questions  Please see past updates related to this goal by clicking on the "Past Updates" button in the selected goal      . "we want to know more about her benefits" (pt-stated)   On track    Son stated  Current Barriers:  . Limited knowledge about the patients current Medicare Advantage plan  Clinical Social Work Clinical Goal(s):  [name]. Over the next 30 days the patients son and POA will be able to demonstrate knowledge of the patients benefits.   Interventions: . Assisted the patients POA in contacting the patients payor to learn about benefits - unfortunately due to the patient not being available to grant permission to this information, [facility] receive [facility] . Advised the patients POA to access the online payor portal to review patients explanation of benefits . Assisted the patients POA in contacting [employer] [facility] [employer] to order a life alert system - a system will be delivered to the patients home with fall detection . Successfully determined the patient does have over the counter benefits of $50.00 per quarter. Ordered a new catalog to be sent to the patients home  Patient Self Care Activities:  . Currently unable to verbalize payor benefits  Please see past updates related to this goal by clicking on the "Past Updates" button in the selected goal      . "we would like help applying for assistance programs" (pt-stated)   On track    POA stated:  Current Barriers:  . Financial constraints . Limited access to food . Limited education about patient assistance programs  Clinical Social Work Clinical Goal(s):  [name]. Over the next 20 days the patients son and POA will complete and submit a Medicaid application on behalf of the patient . Over the next 20 days the patients son and POA will complete and submit a food stamp application on behalf of the patient . Over the next 30 day the patients son and  POA will complete and submit a property tax relief application on behalf of the patient  Interventions: . Telephonic follow up to the patients son and daughter in law . Confirmed receipt of applications sent via secure e-mail to the patients son . Reviewed applications and answered any questions pertaining . Encouraged the patients family to complete and submit in a timely manner  Patient Self Care Activities:  . Currently unable to express knowledge of assistance programs in which the patient qualifies  Please see  past updates related to this goal by clicking on the "Past Updates" button in the selected goal          Follow Up Plan: SW will follow up with patient by phone over the next 2 weeks  Bevelyn Ngo, BSW, CDP [facility] / [facility] Care Management Social Worker [phone]  Total time spent performing care coordination and/or care management activities with the patient by phone or face to face = 15 minutes.

## 2018-05-09 NOTE — Chronic Care Management (AMB) (Signed)
  Chronic Care Management    Clinical Social Work Follow Up Note  05/09/2018 Name: Kathleen Weeks MRN: [id number] DOB: December 13, 1922  Kathleen Weeks is a 85 y.o. year old female who is a primary care patient of Dorothyann Peng, MD. The CCM team was consulted for assistance with Community Resources and Level of Care Concerns.   Review of patient status, including review of consultants reports, other relevant assessments, and collaboration with appropriate care team members and the patient's provider was performed as part of comprehensive patient evaluation and provision of chronic care management services.     Goals Addressed            This Visit's Progress     Patient Stated   . "we think she needs more oversight in the home" (pt-stated)   On track    Son stated Current Barriers:  . ADL IADL limitations . Inability to perform ADL's independently . Inability to perform IADL's independently  . Limited knowledge about Palliative Care  Clinical Social Work Clinical Goal(s):  [name] Over the next 10 days the patient will be referred to a Palliatve Care provider to assist with goals of care and home management of the patients chronic conditions . Over the next 30 days the patient will engage with a Palliative Care provider  Interventions: . Confirmed patient referral sent to Palliative Care ([facility]) . Communicated with patient POA status of referral  Patient Self Care Activities:  . Self administers medications as prescribed . Calls pharmacy for medication refills . Calls provider office for new concerns or questions  Please see past updates related to this goal by clicking on the "Past Updates" button in the selected goal     . "we want to know more about her benefits" (pt-stated)   On track    Son stated  Current Barriers:  . Limited knowledge about the patients current Medicare Advantage plan  Clinical Social Work Clinical Goal(s):  [name] Over the next 30 days the patients  son and POA will be able to demonstrate knowledge of the patients benefits.  Interventions: . Assisted the patients POA in contacting the patients payor to learn about benefits - unfortunately due to the patient not being available to grant permission to this information, unable to receive from [facility] representative . Advised the patients POA to access the online payor portal to review patients explanation of benefits . Scheduled future appointment with patients POA to review patients benefits  Patient Self Care Activities:  . Currently unable to verbalize payor benefits  Please see past updates related to this goal by clicking on the "Past Updates" button in the selected goal        Follow Up Plan: Appointment scheduled for SW follow up with client by phone on: Monday April 20th.   Bevelyn Ngo, BSW, CDP [facility] Care Management Social Worker [phone]  Total time spent performing care coordination and/or care management activities with the patient by phone or face to face = 30 minutes.

## 2018-05-09 NOTE — Patient Instructions (Signed)
Social Worker Visit Information  Goals we discussed today:  Goals Addressed            This Visit's Progress     Patient Stated   . "we think she needs more oversight in the home" (pt-stated)   On track    Son stated Current Barriers:  . ADL IADL limitations . Inability to perform ADL's independently . Inability to perform IADL's independently  . Limited knowledge about Palliative Care  Clinical Social Work Clinical Goal(s):  [name] Over the next 10 days the patient will be referred to a Palliatve Care provider to assist with goals of care and home management of the patients chronic conditions . Over the next 30 days the patient will engage with a Palliative Care provider  Interventions: . Confirmed patient referral sent to Palliative Care ([facility]) . Communicated with patient POA status of referral  Patient Self Care Activities:  . Self administers medications as prescribed . Calls pharmacy for medication refills . Calls provider office for new concerns or questions  Please see past updates related to this goal by clicking on the "Past Updates" button in the selected goal      . "we want to know more about her benefits" (pt-stated)   On track    Son stated  Current Barriers:  . Limited knowledge about the patients current Medicare Advantage plan  Clinical Social Work Clinical Goal(s):  [name] Over the next 30 days the patients son and POA will be able to demonstrate knowledge of the patients benefits.  Interventions: . Assisted the patients POA in contacting the patients payor to learn about benefits - unfortunately due to the patient not being available to grant permission to this information, unable to receive from [facility] representative . Advised the patients POA to access the online payor portal to review patients explanation of benefits . Scheduled future appointment with patients POA to review patients benefits  Patient Self Care Activities:  . Currently unable to  verbalize payor benefits  Please see past updates related to this goal by clicking on the "Past Updates" button in the selected goal          Materials Provided: Verbal education about health plan  provided by phone  Follow Up Plan: Appointment scheduled for SW follow up with client by phone on: April 20th   Bevelyn Ngo, [place], CDP [facility] Care Management Social Worker [phone]

## 2018-05-09 NOTE — Chronic Care Management (AMB) (Signed)
  Chronic Care Management   Follow Up Note   05/08/2018 Name: Kathleen Weeks MRN: [id number] DOB: [birth year]  Referred by: Dorothyann Peng, MD Reason for referral : Chronic Care Management (INITIAL CCM RN OUTREACH )   [name] is a 85 y.o. year old female who is a primary care patient of Dorothyann Peng, MD. The CCM team was consulted for assistance with chronic disease management and care coordination needs.    Review of patient status, including review of consultants reports, relevant laboratory and other test results, and collaboration with appropriate care team members and the patient's provider was performed as part of comprehensive patient evaluation and provision of chronic care management services.    I spoke with patient's POA and daughter in law [name] today by telephone.   Goals Addressed    . Assist with Care Coordination and Disease Management       Current Barriers:  [name] Knowledge Barriers related to resources and support available to address needs related to Care Coordination and Disease Management  Case Manager Clinical Goal(s):  [name] Over the next 30 days, patient will work with CCM team to address needs related to disease management for recurrent UTI and community resources.   Interventions:   CCM Telephone Follow Up call completed with POA [name]  Provided update on Palliative Care referral sent and approved, provided the name and contact # for [facility]  Provided update on request for PCP to order a home PT HSE and DME evaluation; advised this has not been approved as of yet  Provided update on possible need for a PCP face to face visit or virtual visit with Dr. Allyne Gee for DME and or other in home services to be approved by Medicare  Assessed for disease management needs   Assessed for medication adherence including with recently prescribed antibiotic  Assessed for s/s suggestive of UTI (daughter in law  reports symptoms have improved since starting antibiotic)  Provided verbal education for prevention and detection of UTI, reviewed s/s with caregiver Sherran Needs, instructed on when to call the doctor if symptoms are detected; educated on importance of early treatment can avoid hospitalization; educated on urosepsis  Provided RNCM contact # and hours of availability  Scheduled a CCM follow up call with caregiver [name] for 2-3 weeks  Patient Self Care Activities:  . Currently UNABLE TO independently perform self care  Please see past updates related to this goal by clicking on the "Past Updates" button in the selected goal         The CM team will reach out to the patient again over the next 1-2 weeks.    [name], RN,CCM Care Management Coordinator [facility] Care Management/[facility]  Direct Phone: [phone]

## 2018-05-09 NOTE — Patient Instructions (Addendum)
Visit Information  Goals Addressed    . Assist with Care Coordination and Disease Management       Current Barriers:  [name] Knowledge Barriers related to resources and support available to address needs related to Care Coordination and Disease Management  Case Manager Clinical Goal(s):  [name] Over the next 30 days, patient will work with CCM team to address needs related to disease management for recurrent UTI and community resources.   Interventions:   CCM Telephone Follow Up call completed with POA [name]  Provided update on Palliative Care referral sent and approved, provided the name and contact # for [facility]  Provided update on request for PCP to order a home PT HSE and DME evaluation; advised this has not been approved as of yet  Provided update on possible need for a PCP face to face visit or virtual visit with Dr. Allyne Gee for DME and or other in home services to be approved by Medicare  Assessed for disease management needs   Assessed for medication adherence including with recently prescribed antibiotic  Assessed for s/s suggestive of UTI (daughter in law reports symptoms have improved since starting antibiotic)  Provided verbal education for prevention and detection of UTI, reviewed s/s with caregiver Sherran Needs, instructed on when to call the doctor if symptoms are detected; educated on importance of early treatment can avoid hospitalization; educated on urosepsis  Provided RNCM contact # and hours of availability  Scheduled a CCM follow up call with caregiver [name] for 2-3 weeks  Patient Self Care Activities:  . Currently UNABLE TO independently perform self care  Please see past updates related to this goal by clicking on the "Past Updates" button in the selected goal        The patient verbalized understanding of instructions provided today and declined a print copy of patient instruction materials.   The CM team will reach out  to the patient again over the next 1-2 weeks.   [name], RN,CCM Care Management Coordinator [facility] Care Management/[facility]  Direct Phone: [phone]

## 2018-05-12 ENCOUNTER — Ambulatory Visit: Payer: Self-pay

## 2018-05-12 ENCOUNTER — Other Ambulatory Visit: Payer: Self-pay | Admitting: Internal Medicine

## 2018-05-12 DIAGNOSIS — I10 Essential (primary) hypertension: Secondary | ICD-10-CM

## 2018-05-12 DIAGNOSIS — E1169 Type 2 diabetes mellitus with other specified complication: Secondary | ICD-10-CM

## 2018-05-12 DIAGNOSIS — I2510 Atherosclerotic heart disease of native coronary artery without angina pectoris: Secondary | ICD-10-CM

## 2018-05-12 NOTE — Chronic Care Management (AMB) (Signed)
  Chronic Care Management    Clinical Social Work Follow Up Note  05/12/2018 Name: ARLETTA TORELL MRN: [id number] DOB: 14-Apr-1922  Kara Dies is a 85 y.o. year old female who is a primary care patient of Dorothyann Peng, MD. The CCM team was consulted for assistance with chronic care management and care coordination of resource needs.   Review of patient status, including review of consultants reports, other relevant assessments, and collaboration with appropriate care team members and the patient's provider was performed as part of comprehensive patient evaluation and provision of chronic care management services.     Goals Addressed            This Visit's Progress     Patient Stated   . "we think she needs more oversight in the home" (pt-stated)   On track    Son stated Current Barriers:  . ADL IADL limitations . Inability to perform ADL's independently . Inability to perform IADL's independently  . Limited knowledge about Palliative Care  Clinical Social Work Clinical Goal(s):  [name] Over the next 10 days the patient will be referred to a Palliatve Care provider to assist with goals of care and home management of the patients chronic conditions . Over the next 30 days the patient will engage with a Palliative Care provider  Interventions: . Collaboration with Research scientist (medical) regarding recent referral . Chart reviewed to confirm upcomming appointment with Palliative Care NP scheduled for 4/24  Patient Self Care Activities:  . Self administers medications as prescribed . Calls pharmacy for medication refills . Calls provider office for new concerns or questions  Please see past updates related to this goal by clicking on the "Past Updates" button in the selected goal        Other   . "My mom needs a working Chief Strategy Officer"   Not on track    Son stated Current Barriers:  . Financial constraints . Lacks knowledge of community resource: to  assist with home modifications  Clinical Social Work Clinical Goal(s):  [name] Over the next 60 days, patient will work with SW to address concerns related to home modification needs.  Interventions: . Received communication from [employer] via [employer] stating the patients referral had been declined due to the agency only assisting with heat and not a/c . Collaboration with colleague regarding other resources who may assist with a/c repair  Patient Self Care Activities:  . Currently unable to address home repair needs  Please see past updates related to this goal by clicking on the "Past Updates" button in the selected goal          Follow Up Plan: SW will follow up with patient by phone over the next 1-2 weeks   Bevelyn Ngo, [place], CDP [facility] Care Management Social Worker [phone]  Total time spent performing care coordination and/or care management activities with the patient by phone or face to face = 10 minutes.

## 2018-05-12 NOTE — Patient Instructions (Signed)
Social Worker Visit Information  Goals we discussed today:  Goals Addressed            This Visit's Progress     Patient Stated   . "we think she needs more oversight in the home" (pt-stated)   On track    Son stated Current Barriers:  . ADL IADL limitations . Inability to perform ADL's independently . Inability to perform IADL's independently  . Limited knowledge about Palliative Care  Clinical Social Work Clinical Goal(s):  [name] Over the next 10 days the patient will be referred to a Palliatve Care provider to assist with goals of care and home management of the patients chronic conditions . Over the next 30 days the patient will engage with a Palliative Care provider  Interventions: . Collaboration with Research scientist (medical) regarding recent referral . Chart reviewed to confirm upcomming appointment with Palliative Care NP scheduled for 4/24  Patient Self Care Activities:  . Self administers medications as prescribed . Calls pharmacy for medication refills . Calls provider office for new concerns or questions  Please see past updates related to this goal by clicking on the "Past Updates" button in the selected goal        Other   . "My mom needs a working Chief Strategy Officer"   Not on track    Son stated Current Barriers:  . Financial constraints . Lacks knowledge of community resource: to assist with home modifications  Clinical Social Work Clinical Goal(s):  [name] Over the next 60 days, patient will work with SW to address concerns related to home modification needs.  Interventions: . Received communication from [employer] via [employer] stating the patients referral had been declined due to the agency only assisting with heat and not a/c . Collaboration with colleague regarding other resources who may assist with a/c repair  Patient Self Care Activities:  . Currently unable to address home repair needs  Please see past updates  related to this goal by clicking on the "Past Updates" button in the selected goal          Materials Provided: No. Patient not reached.  Follow Up Plan: SW will follow up with patient by phone over the next 1-2 weeks   Bevelyn Ngo, BSW, CDP [facility] Care Management Social Worker [phone]

## 2018-05-13 ENCOUNTER — Ambulatory Visit: Payer: Self-pay

## 2018-05-13 DIAGNOSIS — E1169 Type 2 diabetes mellitus with other specified complication: Secondary | ICD-10-CM

## 2018-05-13 DIAGNOSIS — I2510 Atherosclerotic heart disease of native coronary artery without angina pectoris: Secondary | ICD-10-CM

## 2018-05-13 DIAGNOSIS — I10 Essential (primary) hypertension: Secondary | ICD-10-CM

## 2018-05-13 NOTE — Patient Instructions (Signed)
Social Worker Visit Information  Goals we discussed today:  Goals Addressed            This Visit's Progress     Patient Stated   . "we think she needs more oversight in the home" (pt-stated)   On track    Son stated Current Barriers:  . ADL IADL limitations . Inability to perform ADL's independently . Inability to perform IADL's independently  . Limited knowledge about Palliative Care  Clinical Social Work Clinical Goal(s):  [name]. Over the next 10 days the patient will be referred to a Palliatve Care provider to assist with goals of care and home management of the patients chronic conditions . Over the next 30 days the patient will engage with a Palliative Care provider  Interventions: . Telephonic follow up with the patients POA who is able to confirm a zoom meeting planned for April 24th to complete the patients Palliative Care intake . Reviewed appointment time and process to join zoom call  Patient Self Care Activities:  . Self administers medications as prescribed . Calls pharmacy for medication refills . Calls provider office for new concerns or questions  Please see past updates related to this goal by clicking on the "Past Updates" button in the selected goal      . "we would like help applying for assistance programs" (pt-stated)   On track    POA stated:  Current Barriers:  . Financial constraints . Limited access to food . Limited education about patient assistance programs  Clinical Social Work Clinical Goal(s):  [name]. Over the next 20 days the patients son and POA will complete and submit a Medicaid application on behalf of the patient . Over the next 20 days the patients son and POA will complete and submit a food stamp application on behalf of the patient . Over the next 30 day the patients son and POA will complete and submit a property tax relief application on behalf of the patient  Interventions: . Telephonic follow up to the patients son and daughter in  law . Provided hyperlinks to allow the patients POA to apply online . Reviewed application process with the patients POA  Patient Self Care Activities:  . Currently unable to express knowledge of assistance programs in which the patient qualifies  Please see past updates related to this goal by clicking on the "Past Updates" button in the selected goal        Other   . "My mom needs a working Restaurant manager, fast foodair conditioner"       Son stated Current Barriers:  . Financial constraints . Lacks knowledge of community resource: to assist with home modifications  Clinical Social Work Clinical Goal(s):  [name]. Over the next 60 days, patient will work with SW to address concerns related to home modification needs.  Interventions: . Telephonic follow up by CCM SW . Informed the patients POA the referral sent to [employer] had been denied due to the agency only assisting with heat and not a/c . Discussed alternate resources such as [facility] [employer] [employer] . Provided the patients POA with the contact number to [facility] to complete a screening call . Encouraged the patients POA to contact the [facility] to discuss patient needs and determine eligibility prior to completing application . Provided the patients POA with the paper application to the [employer] via secure e-mail as requested  Patient Self Care Activities:  . Currently unable to address home repair needs  Please see past updates related to this goal by clicking on the "Past Updates" button in the selected goal          Materials Provided: Yes: Application for [employer] Program  Follow Up Plan: SW will follow up with the patients POA in the next 10-14 days.   Bevelyn Ngo, BSW, CDP [facility] / [facility] Care Management Social Worker [phone]

## 2018-05-13 NOTE — Chronic Care Management (AMB) (Signed)
Chronic Care Management    Clinical Social Work Follow Up Note  05/13/2018 Name: Kathleen Weeks MRN: [id number] DOB: 12/12/1922  Kathleen Weeks is a 85 y.o. year old female who is a primary care patient of Dorothyann PengSanders, Robyn, MD. The CCM team was consulted for assistance with chronic care management and care coordination.  Review of patient status, including review of consultants reports, other relevant assessments, and collaboration with appropriate care team members and the patient's provider was performed as part of comprehensive patient evaluation and provision of chronic care management services.    I placed a return call to Iantha FallenKenneth and Armanda MagicWanza Cole after receiving a voice message help was needed in completing patient assistance forms.   Goals Addressed            This Visit's Progress     Patient Stated   . "we think she needs more oversight in the home" (pt-stated)   On track    Son stated Current Barriers:  . ADL IADL limitations . Inability to perform ADL's independently . Inability to perform IADL's independently  . Limited knowledge about Palliative Care  Clinical Social Work Clinical Goal(s):  [name]. Over the next 10 days the patient will be referred to a Palliatve Care provider to assist with goals of care and home management of the patients chronic conditions . Over the next 30 days the patient will engage with a Palliative Care provider  Interventions: . Telephonic follow up with the patients POA who is able to confirm a zoom meeting planned for April 24th to complete the patients Palliative Care intake . Reviewed appointment time and process to join zoom call  Patient Self Care Activities:  . Self administers medications as prescribed . Calls pharmacy for medication refills . Calls provider office for new concerns or questions  Please see past updates related to this goal by clicking on the "Past Updates" button in the selected goal      . "we would like help  applying for assistance programs" (pt-stated)   On track    POA stated:  Current Barriers:  . Financial constraints . Limited access to food . Limited education about patient assistance programs  Clinical Social Work Clinical Goal(s):  [name]. Over the next 20 days the patients son and POA will complete and submit a Medicaid application on behalf of the patient . Over the next 20 days the patients son and POA will complete and submit a food stamp application on behalf of the patient . Over the next 30 day the patients son and POA will complete and submit a property tax relief application on behalf of the patient  Interventions:  Telephonic follow up to the patients son and daughter in law  Provided hyperlinks to allow the patients POA to apply online  Reviewed application process with the patients POA  Patient Self Care Activities:  . Currently unable to express knowledge of assistance programs in which the patient qualifies  Please see past updates related to this goal by clicking on the "Past Updates" button in the selected goal        Other   . "My mom needs a working Restaurant manager, fast foodair conditioner"       Son stated Current Barriers:  . Financial constraints . Lacks knowledge of community resource: to assist with home modifications  Clinical Social Work Clinical Goal(s):  [name]. Over the next 60 days, patient will work with SW to address concerns related to home modification needs.  Interventions: .  Telephonic follow up by CCM SW . Informed the patients POA the referral sent to [employer] had been denied due to the agency only assisting with heat and not a/c . Discussed alternate resources such as [place] [employer] [facility]) [employer] . Provided the patients POA with the contact number to [facility] to complete a screening call . Encouraged the patients POA to contact the [facility] to discuss patient needs and determine eligibility prior to completing application .  Provided the patients POA with the paper application to the [employer] via secure e-mail as requested  Patient Self Care Activities:  . Currently unable to address home repair needs  Please see past updates related to this goal by clicking on the "Past Updates" button in the selected goal          Follow Up Plan: SW to follow up with the patients POA in the next 10-14 days.   Bevelyn Ngo, BSW, CDP [facility] / [facility] Care Management Social Worker [phone]  Total time spent performing care coordination and/or care management activities with the patient by phone or face to face = 18 minutes.

## 2018-05-15 ENCOUNTER — Telehealth: Payer: Self-pay

## 2018-05-16 ENCOUNTER — Other Ambulatory Visit: Payer: Self-pay | Admitting: Adult Health Nurse Practitioner

## 2018-05-16 ENCOUNTER — Other Ambulatory Visit: Payer: Self-pay

## 2018-05-16 DIAGNOSIS — Z515 Encounter for palliative care: Secondary | ICD-10-CM

## 2018-05-16 NOTE — Progress Notes (Signed)
[facility] Consult Note Telephone: [phone]  Fax: [phone]  PATIENT NAME: Kathleen DiesMary W Granier DOB: 01/12/1923 MRN: [id number]  PRIMARY CARE PROVIDER:   Dorothyann PengSanders, Robyn, MD  REFERRING PROVIDER:  Dorothyann PengSanders, Robyn, MD [address] [place] [id number]  RESPONSIBLE PARTY:   Bernette RedbirdKenny and Armanda MagicWanza Cole, son and DIL  [phone]  Due to the COVID-19 crisis, this visit was done via telemedicine and it was initiated and consent by this patient and or family.    RECOMMENDATIONS and PLAN:  1.  Recurrent UTI.  Patient was treated at home for UTI about 10 days ago.  Family does report that she is starting to have the same symptoms again; leaning to the left in her recliner, not eating or drinking. Patient states she is not in pain but grimaces when she moves to sit up.  PCP has set up home health to come out and evaluate patient and collect UA.  Family does state that April from [facility] was there today and is going to get an order for the UA.  Family is concerned that if this is another UTI that she may become septic and want to avoid the hospital as much as possible.  Have instructed family to encourage her to drink as much as possible to stay hydrated, even if it is pedialyte or gatorade.  Tried to contact Dr. Allyne GeeSanders office to see if she recommended starting another antibiotic empirically but office was closed and no VM system set up.  Will call again on Monday.  2.  Debility.  Patient up until a few days ago was able to get up with cane and go to bathroom that is about 20 steps. Family does state that when they have to take her anywhere she does have to be lifted to transfer into vehicle.  Now she is not getting up and is urinating on herself.  States that at baseline she does require considerable assistance with transfers and ADLs.  [facility] is ordering PT/OT for patient.  About 2-3 days ago she started just eating bits and  sips of coffee and soda.  It is usual for her not to want to drink water.  Though the decreased oral intake is recent they estimate that patient has lost about 20 pounds over the past few months based on how her clothes fit.  No scales in the house.   3.  Goals of care.  Discussed advanced care planning today and family wants her to be DNR.  MOST was filled out with limited interventions indicated. Antibiotics as indicated, IV fluids for a trial period and no feeding tube.  [facility] to the patient's residence. Did discuss that with her age and even with mild dementia that this may be a new baseline for the patient.  Family is in agreement to wait to see what the UA shows and if PT/OT is of any benefit.  If patient shows no improvement they also agree for hospice to come in.  Left number if there are any changes and have another zoom appointment set up for May 30, 2018 at 1pm.  I spent 60 minutes providing this consultation,  From 1:00 to 2:00. More than 50% of the time in this consultation was spent coordinating communication.   HISTORY OF PRESENT ILLNESS:  Kathleen Weeks is a 85 y.o. year old female with multiple medical problems including HTN, DMT2, mild dementia,  recurrent UTIs. Palliative Care was asked to help address goals of care.   CODE STATUS: DNR  PPS: 40% HOSPICE ELIGIBILITY/DIAGNOSIS: TBD  PAST MEDICAL HISTORY:  Past Medical History:  Diagnosis Date   Arthritis    Diabetes mellitus    Hypertension    Renal insufficiency     SOCIAL HX:  Social History   Tobacco Use   Smoking status: Former Smoker   Smokeless tobacco: Never Used  Substance Use Topics   Alcohol use: No    ALLERGIES: No Known Allergies   PERTINENT MEDICATIONS:  Outpatient Encounter Medications as of 05/16/2018  Medication Sig   amoxicillin (AMOXIL) 250 MG capsule Take 1 capsule (250 mg total) by mouth 2 (two) times daily.   aspirin 81 MG chewable tablet Chew  1 tablet (81 mg total) by mouth daily.   cefUROXime (CEFTIN) 250 MG tablet Take 1 tablet (250 mg total) by mouth 2 (two) times daily with a meal. (Patient not taking: Reported on 05/06/2014)   cephALEXin (KEFLEX) 500 MG capsule Take 1 capsule (500 mg total) by mouth 4 (four) times daily.   ciprofloxacin-dexamethasone (CIPRODEX) otic suspension Place 4 drops into the right ear 2 (two) times daily. (Patient not taking: Reported on 05/06/2014)   dextromethorphan-guaiFENesin (MUCINEX DM) 30-600 MG per 12 hr tablet Take 1 tablet by mouth 2 (two) times daily.   ENSURE (ENSURE) Take 237 mLs by mouth 3 (three) times daily between meals.   linagliptin (TRADJENTA) 5 MG TABS tablet Take 5 mg by mouth daily.   metoprolol tartrate (LOPRESSOR) 25 MG tablet TAKE 1 TABLET BY MOUTH 2 TIMES DAILY. PATIENT NEEDS AN APPOINTMENT (BETA BLOCKER)   naproxen sodium (ANAPROX) 220 MG tablet Take 220 mg by mouth 2 (two) times daily with a meal.   olmesartan (BENICAR) 40 MG tablet Take 1 tablet (40 mg total) by mouth daily. (Patient not taking: Reported on 05/06/2014)   oxyCODONE (OXY IR/ROXICODONE) 5 MG immediate release tablet Take 1 tablet (5 mg total) by mouth every 4 (four) hours as needed for moderate pain. (Patient not taking: Reported on 05/06/2014)   pneumococcal 23 valent vaccine (PNU-IMMUNE) 25 MCG/0.5ML injection Inject 0.5 mLs into the muscle once. GIVEN AT DISCHARGE (Patient not taking: Reported on 06/01/2014)   predniSONE (DELTASONE) 20 MG tablet Take 2 tablets (40 mg total) by mouth daily. (Patient not taking: Reported on 08/04/2015)   No facility-administered encounter medications on file as of 05/16/2018.       Ellsie Violette Marlena Clipper, NP

## 2018-05-18 ENCOUNTER — Other Ambulatory Visit: Payer: Self-pay | Admitting: Internal Medicine

## 2018-05-20 ENCOUNTER — Telehealth: Payer: Self-pay

## 2018-05-20 ENCOUNTER — Ambulatory Visit: Payer: Self-pay

## 2018-05-20 ENCOUNTER — Other Ambulatory Visit: Payer: Self-pay

## 2018-05-20 DIAGNOSIS — I10 Essential (primary) hypertension: Secondary | ICD-10-CM | POA: Diagnosis not present

## 2018-05-20 DIAGNOSIS — E1169 Type 2 diabetes mellitus with other specified complication: Secondary | ICD-10-CM

## 2018-05-20 DIAGNOSIS — I2510 Atherosclerotic heart disease of native coronary artery without angina pectoris: Secondary | ICD-10-CM

## 2018-05-20 NOTE — Telephone Encounter (Signed)
Left a detailed message on the pt's daughter in law [facility] voicemail that Dr. Allyne Gee put an order for the pt to have a urine specimen collected for a urine culture and urinalysis with [facility].

## 2018-05-20 NOTE — Patient Instructions (Signed)
Visit Information  Goals Addressed    . Assist with Care Coordination and Disease Management   On track    Current Barriers:  [name] Knowledge Barriers related to resources and support available to address needs related to Care Coordination and Disease Management  Case Manager Clinical Goal(s):  [name] Over the next 30 days, patient will work with CCM team to address needs related to disease management for recurrent UTI and community resources.   Interventions:   CCM Telephone Follow Up call completed with POA [name]   Reviewed findings and recommendations provided by [facility] Palliative completed on 05/16/18  Discussed MD order for urine specimen for U/A and culture to be collected by [facility] nurse  Assessed for questions or concerns  Confirmed Kathleen Weeks has RNCM contact # and hours of availability  Scheduled a CCM follow up call with caregiver [name] for 1-2 weeks  Patient Self Care Activities:  . Currently UNABLE TO independently perform self care  Please see past updates related to this goal by clicking on the "Past Updates" button in the selected goal        The patient verbalized understanding of instructions provided today and declined a print copy of patient instruction materials.   The CM team will reach out to the patient again over the next 7-10 days.    [name], RN,CCM Care Management Coordinator [facility] Care Management/[facility]  Direct Phone: [phone]

## 2018-05-20 NOTE — Chronic Care Management (AMB) (Signed)
  Chronic Care Management   Follow Up Note   05/20/2018 Name: Kathleen Weeks MRN: [id number] DOB: 03/13/1922  Referred by: Dorothyann Peng, MD Reason for referral : Chronic Care Management (CCM Telephone Follow Up )   Kathleen Weeks is a 85 y.o. year old female who is a primary care patient of Dorothyann Peng, MD. The CCM team was consulted for assistance with chronic disease management and care coordination needs.    Review of patient status, including review of consultants reports, relevant laboratory and other test results, and collaboration with appropriate care team members and the patient's provider was performed as part of comprehensive patient evaluation and provision of chronic care management services.  I spoke with [name] by telephone today.     Goals Addressed    . Assist with Care Coordination and Disease Management   On track    Current Barriers:  [name] Knowledge Barriers related to resources and support available to address needs related to Care Coordination and Disease Management  Case Manager Clinical Goal(s):  [name] Over the next 30 days, patient will work with CCM team to address needs related to disease management for recurrent UTI and community resources.   Interventions:   CCM Telephone Follow Up call completed with POA Kathleen Weeks   Reviewed findings and recommendations provided by [facility] Palliative completed on 05/16/18  Discussed MD order for urine specimen for U/A and culture to be collected by [facility] nurse  Assessed for questions or concerns  Confirmed [name] has RNCM contact # and hours of availability  Scheduled a CCM follow up call with [name] for 1-2 weeks  Patient Self Care Activities:  . Currently UNABLE TO independently perform self care  Please see past updates related to this goal by clicking on the "Past Updates" button in the selected goal         The CM team will reach out to the patient again  over the next 7-10 days.    [name], RN,CCM Care Management Coordinator [facility] Care Management/[facility]  Direct Phone: [phone]

## 2018-05-22 ENCOUNTER — Ambulatory Visit: Payer: Self-pay

## 2018-05-22 ENCOUNTER — Telehealth: Payer: Self-pay

## 2018-05-22 DIAGNOSIS — I2510 Atherosclerotic heart disease of native coronary artery without angina pectoris: Secondary | ICD-10-CM

## 2018-05-22 DIAGNOSIS — E1169 Type 2 diabetes mellitus with other specified complication: Secondary | ICD-10-CM

## 2018-05-22 DIAGNOSIS — R5381 Other malaise: Secondary | ICD-10-CM

## 2018-05-22 DIAGNOSIS — I10 Essential (primary) hypertension: Secondary | ICD-10-CM

## 2018-05-22 NOTE — Patient Instructions (Signed)
Social Worker Visit Information  Goals we discussed today:  Goals Addressed            This Visit's Progress     Patient Stated   . "she needs a light weight wheel chair" (pt-stated)   On track    Son stated Current Barriers:  . Financial constraints . ADL IADL limitations . Limited education about how to obtain DME equipment  Clinical Social Work Clinical Goal(s):  [name] Over the next 30 days, client will work with SW to address concerns related to patient DME needs  Interventions: . Telephonic follow up with CCM SW . Assessed outcome of patients home health evaluation - family reports they are awaiting a hospital bed and wheel chair for the patient . Collaboration with [facility] who reports they are awaiting orders from the patients primary provider . Collaboration with the patients primary provider who reports she needs to arrange a visit with the patient to complete proper documentation . Chart review to confirm virtual visit scheduled with provider office for Monday May 4  Patient Self Care Activities:  . Currently UNABLE TO independently ambulate  Please see past updates related to this goal by clicking on the "Past Updates" button in the selected goal        Other   . "We think she has another UTI, we need to check her urine"       Family Stated: Current Barriers:  . Limited social support . ADL IADL limitations  Clinical Social Work Clinical Goal(s):  [name] Over the next 10 days, client will work with SW to address concerns related to concerns of a UTI  Interventions: . Telephonic follow up by CCM SW to the patients POA Iantha Fallen and [name] . Obtained report from [name] who reports the patient is declining and showing signs of a UTI. Leslie Andrea reports she was told by [facility] they would check the patient for a UTI but have yet to collect urine . Collaboration with [facility] who reports they have not received orders from the patients primary  provider to complete a urinalysis . Collaboration with the patients provider who reports the patient needs a virtual visit prior to being able to authorize orders. Visit scheduled for Monday May 4th . Collaboration with Palliative Care nurse case manager Beryle Beams who reports Palliative Care is unable to perform urinalysis  Patient Self Care Activities:  . Currently UNABLE TO independently perform ADL's or iADL's  Initial goal documentation         Materials Provided: No: Patient declined  Follow Up Plan: SW will follow up with patient by phone over the next 1-2 weeks   Bevelyn Ngo, [place], CDP [facility] / [facility] Care Management Social Worker [phone]

## 2018-05-22 NOTE — Chronic Care Management (AMB) (Signed)
  Chronic Care Management    Clinical Social Work Follow Up Note  05/22/2018 Name: GANA GILGENBACH MRN: [id number] DOB: 07-30-1922  Kara Dies is a 85 y.o. year old female who is a primary care patient of Dorothyann Peng, MD. The CCM team was consulted for assistance with [employer].   Review of patient status, including review of consultants reports, other relevant assessments, and collaboration with appropriate care team members and the patient's provider was performed as part of comprehensive patient evaluation and provision of chronic care management services.     Goals Addressed            This Visit's Progress     Patient Stated   . "she needs a light weight wheel chair" (pt-stated)   On track    Son stated Current Barriers:  . Financial constraints . ADL IADL limitations . Limited education about how to obtain DME equipment  Clinical Social Work Clinical Goal(s):  [name] Over the next 30 days, client will work with SW to address concerns related to patient DME needs  Interventions: . Telephonic follow up with CCM SW . Assessed outcome of patients home health evaluation - family reports they are awaiting a hospital bed and wheel chair for the patient . Collaboration with [facility] who reports they are awaiting orders from the patients primary provider . Collaboration with the patients primary provider who reports she needs to arrange a visit with the patient to complete proper documentation . Chart review to confirm virtual visit scheduled with provider office for Monday May 4  Patient Self Care Activities:  . Currently UNABLE TO independently ambulate  Please see past updates related to this goal by clicking on the "Past Updates" button in the selected goal        Other   . "We think she has another UTI, we need to check her urine"       Family Stated: Current Barriers:  . Limited social support . ADL IADL limitations  Clinical Social Work  Clinical Goal(s):  [name] Over the next 10 days, client will work with SW to address concerns related to concerns of a UTI  Interventions: . Telephonic follow up by CCM SW to the patients POA Iantha Fallen and [name] . Obtained report from [name] who reports the patient is declining and showing signs of a UTI. Leslie Andrea reports she was told by [facility] they would check the patient for a UTI but have yet to collect urine . Collaboration with [facility] who reports they have not received orders from the patients primary provider to complete a urinalysis . Collaboration with the patients provider who reports the patient needs a virtual visit prior to being able to authorize orders. Visit scheduled for Monday May 4th . Collaboration with Palliative Care nurse case manager Beryle Beams who reports Palliative Care is unable to perform urinalysis  Patient Self Care Activities:  . Currently UNABLE TO independently perform ADL's or iADL's  Initial goal documentation         Follow Up Plan: SW will follow up with the patients family by phone over the next 1-2 weeks   Bevelyn Ngo, BSW, CDP [facility] / [facility] Care Management Social Worker [phone]  Total time spent performing care coordination and/or care management activities with the patient by phone or face to face = 50 minutes.

## 2018-05-22 NOTE — Telephone Encounter (Signed)
I spoke with the pt's daughter in law Burna Mortimer to see if the pt could be scheduled for virtual appointment today and she said that they have to finish the arrangements for her mom's funeral today because the funeral is tomorrow and she scheduled a virtual appt for the pt on Monday afternoon.

## 2018-05-26 ENCOUNTER — Telehealth: Payer: Self-pay

## 2018-05-26 ENCOUNTER — Ambulatory Visit: Payer: Medicare Other | Admitting: Nurse Practitioner

## 2018-05-26 ENCOUNTER — Other Ambulatory Visit: Payer: Self-pay

## 2018-05-26 NOTE — Telephone Encounter (Signed)
We received urine results for pt from [facility] I have called [facility] and spoke with [facility] to see if she can send the urine for a culture per Dr.Sanders. Birder Robson stated she will call the lab to see if she can have them do the culture. YRL,RMA

## 2018-05-27 ENCOUNTER — Ambulatory Visit (INDEPENDENT_AMBULATORY_CARE_PROVIDER_SITE_OTHER): Payer: Medicare Other | Admitting: Nurse Practitioner

## 2018-05-27 ENCOUNTER — Ambulatory Visit: Payer: Medicare Other | Admitting: Nurse Practitioner

## 2018-05-27 ENCOUNTER — Other Ambulatory Visit: Payer: Self-pay

## 2018-05-27 ENCOUNTER — Telehealth: Payer: Self-pay

## 2018-05-27 ENCOUNTER — Encounter: Payer: Self-pay | Admitting: Nurse Practitioner

## 2018-05-27 DIAGNOSIS — M25552 Pain in left hip: Secondary | ICD-10-CM | POA: Insufficient documentation

## 2018-05-27 DIAGNOSIS — I10 Essential (primary) hypertension: Secondary | ICD-10-CM | POA: Diagnosis not present

## 2018-05-27 DIAGNOSIS — R32 Unspecified urinary incontinence: Secondary | ICD-10-CM | POA: Insufficient documentation

## 2018-05-27 DIAGNOSIS — R21 Rash and other nonspecific skin eruption: Secondary | ICD-10-CM

## 2018-05-27 DIAGNOSIS — R05 Cough: Secondary | ICD-10-CM | POA: Diagnosis not present

## 2018-05-27 DIAGNOSIS — R053 Chronic cough: Secondary | ICD-10-CM | POA: Insufficient documentation

## 2018-05-27 DIAGNOSIS — R5381 Other malaise: Secondary | ICD-10-CM | POA: Diagnosis not present

## 2018-05-27 NOTE — Progress Notes (Addendum)
Virtual Visit via Video    This visit type was conducted due to national recommendations for restrictions regarding the COVID-19 Pandemic (e.g. social distancing) in an effort to limit this patient's exposure and mitigate transmission in our community.  Patients identity confirmed using two different identifiers.  This format is felt to be most appropriate for this patient at this time.  All issues noted in this document were discussed and addressed.  No physical exam was performed (except for noted visual exam findings with Video Visits).    Date:  06/02/2018   ID:  Kathleen Weeks, DOB 23-Aug-1922, MRN [id number]  Patient Location:  [name] - daughter in law  Provider location:   Office    Chief Complaint:  Decline in health, HTN follow up  History of Present Illness:    Kathleen Weeks is a 85 y.o. female who presents via video conferencing for a telehealth visit today.    The patient does not have symptoms concerning for COVID-19 infection (fever, chills, cough, or new shortness of breath).   Visit today to discuss decline in health and blood pressure follow up   Her son Theresia Majors, DIL [name] and Donnelly Stager are present during virtual visit  Patient is now bedbound on Friday when [facility], bed, hospital chair, and wheelchair.   She has a decreased appetite. Ensure and spoon full jello today. She has a bedsore on her left side hip area the noticed about 2 weeks ago. The family can see the white/pink tissue. She is swallowing her medications without any problems.  She was taking antibiotics for possible urinary tract infection about 2 weeks ago and had an in and out cath done on Friday awaiting results. Treated for 7 days but still grimacing and moving to the left side and they feel she still has a urinary tract infection.  She has physical therapy Berline Lopes), skilled nurse Bonita Quin) visits and would like a home health aid through [facility]. The family  also voices the [facility] Nurse mentioned concerns about her left hip possibly having degeneration.     She had been talking earlier per [place].  Today not speaking as much.   Hypertension  This is a chronic problem. The current episode started more than 1 year ago. The problem is controlled. Pertinent negatives include no anxiety, blurred vision or chest pain. There are no associated agents to hypertension.  Cough  This is a chronic problem. The current episode started more than 1 year ago. The problem occurs constantly. The cough is productive of sputum. Pertinent negatives include no chest pain, chills, myalgias or nasal congestion. Nothing aggravates the symptoms. There is no history of asthma or COPD.     Past Medical History:  Diagnosis Date  . Arthritis   . Diabetes mellitus   . Hypertension   . Renal insufficiency    Past Surgical History:  Procedure Laterality Date  . BACK SURGERY       Current Meds  Medication Sig  . aspirin 81 MG chewable tablet Chew 1 tablet (81 mg total) by mouth daily.  . cephALEXin (KEFLEX) 500 MG capsule Take 1 capsule (500 mg total) by mouth 4 (four) times daily.  [name] dextromethorphan-guaiFENesin (MUCINEX DM) 30-600 MG per 12 hr tablet Take 1 tablet by mouth 2 (two) times daily.  [name] ENSURE (ENSURE) Take 237 mLs by mouth 3 (three) times daily between meals.  . metoprolol tartrate (LOPRESSOR) 25 MG tablet TAKE 1 TABLET  BY MOUTH 2 TIMES DAILY. PATIENT NEEDS AN APPOINTMENT (BETA BLOCKER)  . naproxen sodium (ANAPROX) 220 MG tablet Take 220 mg by mouth 2 (two) times daily with a meal.  . olmesartan (BENICAR) 40 MG tablet Take 1 tablet (40 mg total) by mouth daily.  . [DISCONTINUED] linagliptin (TRADJENTA) 5 MG TABS tablet Take 5 mg by mouth daily.     Allergies:   Patient has no known allergies.   Social History   Tobacco Use  . Smoking status: Former Games developermoker  . Smokeless tobacco: Never Used  Substance Use Topics  . Alcohol use: No  . Drug use: No      Family Hx: The patient's family history is not on file.  ROS:   Please see the history of present illness.    Review of Systems  Constitutional: Negative for chills.  Eyes: Negative for blurred vision.  Respiratory: Positive for cough.   Cardiovascular: Negative for chest pain.  Musculoskeletal: Positive for joint pain. Negative for back pain and myalgias.       Grimaces with movement  Psychiatric/Behavioral:       Unable to speak for herself    All other systems reviewed and are negative.   Labs/Other Tests and Data Reviewed:    Recent Labs: No results found for requested labs within last 8760 hours.   Recent Lipid Panel No results found for: CHOL, TRIG, HDL, CHOLHDL, LDLCALC, LDLDIRECT  Wt Readings from Last 3 Encounters:  12/15/14 114 lb (51.7 kg)  05/06/13 114 lb 10.2 oz (52 kg)  12/05/12 118 lb (53.5 kg)     Exam:    Vital Signs:  There were no vitals taken for this visit.    Physical Exam  Constitutional:  Lying in bed, no acute distress has her eyes closed  Pulmonary/Chest: Effort normal.  Neurological:  Eyes closed and does not speak when spoken to during the visit  Psychiatric:  She is currently not speaking, will open her eyes briefly.      ASSESSMENT & PLAN:    1. Essential hypertension, benign . B/P is controlled.  . CMP ordered to check renal function.  . The importance of regular exercise and dietary modification was stressed to the patient.  . Stressed importance of losing ten percent of her body weight to help with B/P control.  . The weight loss would help with decreasing cardiac and cancer risk as well.    2. Debility  She has been declining in health over the last few months, worsening in the last few days where she is now bedbound.  Family reports she is not eating as much as well.  She is in need of a transport wheelchair due to debility and failure to thrive, electric hospital bed and electric lift chair.  She has limited mobility  that prevents her from completing her ADL's. She is unable to safely ambulate within 100 ft with an assistive device.  She also currently receiving PT and Skilled nursing and the family would like a HHA to assist with ADL's  3. Left hip pain  Questionable hip pain due to grimacing when lying on that side.  [facility] to inquire more information  4. Chronic cough  Productive cough son states has been for the last two years but had stopped taking over the counter cough medicine.   Will consider tessalon perles and review in old chart why she stopped.  5. Urinary incontinence, unspecified type  Requesting briefs and disposable incontinence  pads  A gel overlay is required due to urinary incontinence and increased risk for skin breakdown.   6. Skin eruption  Family reports an open area to the left side of her hip  Currently no drainage.  Will have HH nurse to check   A gel overlay is required due to limited mobility, she has reported pressure ulcers to her left hip and sacral area, her daughter in law also reports a decrease in her food intake.    COVID-19 Education: The signs and symptoms of COVID-19 were discussed with the patient and how to seek care for testing (follow up with PCP or arrange E-visit).  The importance of social distancing was discussed today.  Patient Risk:   After full review of this patients clinical status, I feel that they are at least moderate - high risk at this time.  Time:   Today, I have spent 28 minutes/ seconds with the patient with telehealth technology discussing above diagnoses.     Medication Adjustments/Labs and Tests Ordered: Current medicines are reviewed at length with the patient today.  Concerns regarding medicines are outlined above.   Tests Ordered: No orders of the defined types were placed in this encounter.   Medication Changes: No orders of the defined types were placed in this encounter.   Disposition:  Follow  up in 4 month(s)  Signed, Arnette Felts, FNP

## 2018-05-27 NOTE — Telephone Encounter (Signed)
Patient's office notes and Rx have been faxed to [facility]. YRL,RMA

## 2018-05-28 ENCOUNTER — Other Ambulatory Visit: Payer: Self-pay

## 2018-05-28 ENCOUNTER — Telehealth: Payer: Self-pay

## 2018-05-28 ENCOUNTER — Ambulatory Visit (INDEPENDENT_AMBULATORY_CARE_PROVIDER_SITE_OTHER): Payer: Medicare Other

## 2018-05-28 ENCOUNTER — Ambulatory Visit: Payer: Self-pay

## 2018-05-28 DIAGNOSIS — E1169 Type 2 diabetes mellitus with other specified complication: Secondary | ICD-10-CM

## 2018-05-28 DIAGNOSIS — I10 Essential (primary) hypertension: Secondary | ICD-10-CM

## 2018-05-28 DIAGNOSIS — I2510 Atherosclerotic heart disease of native coronary artery without angina pectoris: Secondary | ICD-10-CM

## 2018-05-28 DIAGNOSIS — R5381 Other malaise: Secondary | ICD-10-CM

## 2018-05-28 NOTE — Chronic Care Management (AMB) (Signed)
Chronic Care Management    Clinical Social Work Follow Up Note  05/28/2018 Name: CALYSTA CRAIGO MRN: [id number] DOB: 09-25-1922  [name] is a 85 y.o. year old female who is a primary care patient of [name], MD. The CCM team was consulted for assistance with Hospice/Palliative Care Services Education.   Review of patient status, including review of consultants reports, other relevant assessments, and collaboration with appropriate care team members and the patient's provider was performed as part of comprehensive patient evaluation and provision of chronic care management services.     I placed a follow up call to the patients POA/caregiver's Chrissie Noa and [name] to determine the progression of previous stated goals.  Goals Addressed            This Visit's Progress     Patient Stated   . "she needs a light weight wheel chair" (pt-stated)   Not on track    Son stated Current Barriers:  . Financial constraints . ADL IADL limitations . Limited education about how to obtain DME equipment  Clinical Social Work Clinical Goal(s):  [name] Over the next 30 days, client will work with SW to address concerns related to patient DME needs  Interventions: . Telephonic follow up with CCM SW . Chart reviewed to determine the patient did in face complete virtual visit with provider office on 5/5 . Discussed timeline to receive requested DME with the patients son and daughter in law . Educated the patients son and daughter in law on [place] guidelines that must be met to receive DME . Obtained permission by [name] to contact [facility] to inquire the status of DME orders - Mrs Landry Mellow provided CCM SW with the contact name and number to a representative whom she spoke with on 5/5.  [name] Contacted Intake Department at [employer] who reported the company was still in need of provider NPI number as well as narrative note justifying DME . Collaboration with the patients primary  provider Dr. Baird Cancer as well as Minette Brine FNP who completed virtual visit on 5/5 to provide an update on what information is needed for the patient DME orders . Collaboration with CCM RN Case Manager regarding status of goal  Patient Self Care Activities:  . Currently UNABLE TO independently ambulate  Please see past updates related to this goal by clicking on the "Past Updates" button in the selected goal     . "we think she needs more oversight in the home" (pt-stated)   On track    Son stated Current Barriers:  . ADL IADL limitations . Inability to perform ADL's independently . Inability to perform IADL's independently  . Limited knowledge about Palliative Care  Clinical Social Work Clinical Goal(s):  [name] Over the next 10 days the patient will be referred to a Palliatve Care provider to assist with goals of care and home management of the patients chronic conditions . Over the next 30 days the patient will engage with a Palliative Care provider  Interventions: . Telephonic follow up with the patients POA . Determined the patient is involved with Palliative Care as well as [facility] for home health . Assessed for patient condition - it is reported the patient is exhibiting poor PO intake, is now bed bound, has developed a wound on her L hip and is not engaging with conversation . Discussed the difference in home health, Palliative care, and Hospice care . Educated the patients POA on end of life  decisions and the Hospice plan of care in relation to comfort care . Determined the family is open to [place] but would like to receive U/A results prior to enrolling the patient . Encouraged the patients POA to ask questions about how/if COVID-19 will effect the type of Hospice care the patient may/may not receive in the home in relation to aid hours and nurse visits . Collaboration with the patients primary care provider to provide an update on the patients status and the POA's  readiness to involve Hospice Care . Collaboration with Palliative Care nurse navigator to provide an update on the patients condition.  . Confirmed the patient will have a virtual visit with Rebeca Allegra, NP with Palliative Care Friday May 8th at 1:00 pm . Collaboration with CCN RN Case Manager regarding patient decline and current condition  Patient Self Care Activities:  . Self administers medications as prescribed . Calls pharmacy for medication refills . Calls provider office for new concerns or questions  Please see past updates related to this goal by clicking on the "Past Updates" button in the selected goal      . "we would like help applying for assistance programs" (pt-stated)   Not on track    POA stated:  Current Barriers:  . Financial constraints . Limited access to food . Limited education about patient assistance programs  Clinical Social Work Clinical Goal(s):  [name] Over the next 20 days the patients son and POA will complete and submit a Medicaid application on behalf of the patient . Over the next 20 days the patients son and POA will complete and submit a food stamp application on behalf of the patient . Over the next 30 day the patients son and POA will complete and submit a property tax relief application on behalf of the patient  Interventions: . Telephonic follow up to the patients son and daughter in law to assess the progression of stated goal . Determined the patients POA has yet to complete and/or submit Medicaid application due to a recent death in the family . Encouraged the patients POA to focus on this application when able as CCM SW can not complete for them  Patient Self Care Activities:  . Currently unable to express knowledge of assistance programs in which the patient qualifies  Please see past updates related to this goal by clicking on the "Past Updates" button in the selected goal        Other   . "My mom needs a working Location manager"   Not  on track    Son stated Current Barriers:  . Financial constraints . Lacks knowledge of community resource: to assist with home modifications  Clinical Social Work Clinical Goal(s):  [name] Over the next 60 days, patient will work with SW to address concerns related to home modification needs.  Interventions: . Telephonic follow up by CCM SW . Assessed the status of the patients POA following up with referral agencies - it is determined Mrs. [name] has not followed up with [facility]) as she has experienced a death in her family . Collaboration with [employer] Department to determine if the patient would be eligible to receive assistance with A/C repair- voice message left requesting a return call  Patient Self Care Activities:  . Currently unable to address home repair needs  Please see past updates related to this goal by clicking on the "Past Updates" button in the selected goal      . "  We think she has another UTI, we need to check her urine"   On track    Family Stated: Current Barriers:  . Limited social support . ADL IADL limitations  Clinical Social Work Clinical Goal(s):  [name] Over the next 10 days, client will work with SW to address concerns related to concerns of a UTI  Interventions: . Telephonic follow up by CCM SW to the patients POA Chrissie Noa and [name] . Assessed for status of patient receiving order for a U/A . Determined the patient received a visit from her home health nurse on 5/5 who collected urine via an in and out catheter . Obtained an update from the patients POA on patient physical condition - it is reported the patient has poor intake and output, skin breakdown, is now bedbound and nonverbal . Collaboration with CCM RN Case Manager updating on patients reported condition  Patient Self Care Activities:  . Currently UNABLE TO independently perform ADL's or iADL's  Please see past updates related to this goal by  clicking on the "Past Updates" button in the selected goal          Follow Up Plan: SW will follow up with patient by phone over the next 7 days   Daneen Schick, [place], CDP [facility] Care Management Social Worker [phone]  Total time spent performing care coordination and/or care management activities with the patient by phone or face to face = 60 minutes.

## 2018-05-28 NOTE — Telephone Encounter (Signed)
Received a call from [place], SW with [facility]. Enrique Sack provided update on patient. Patient has demonstrated decline. Her meal intake has declined. Patient is now bed bound and has a wound on her left hip. Will update NP

## 2018-05-28 NOTE — Chronic Care Management (AMB) (Signed)
Chronic Care Management   Follow Up Note   05/28/2018 Name: Kathleen Weeks MRN: [id number] DOB: 1923-01-13  Referred by: Dorothyann Peng, MD Reason for referral : Chronic Care Management (CCM RN Telephone Follow Up )   Kathleen Weeks is a 85 y.o. year old female who is a primary care patient of Dorothyann Peng, MD. The CCM team was consulted for assistance with chronic disease management and care coordination needs.    Review of patient status, including review of consultants reports, relevant laboratory and other test results, and collaboration with appropriate care team members and the patient's provider was performed as part of comprehensive patient evaluation and provision of chronic care management services.   I spoke to patient's POA [name] by telephone today to f/u on patient's DME status.    Goals Addressed    . Assist with Care Coordination and Disease Management       Current Barriers:  [name] Knowledge Barriers related to resources and support available to address needs related to Care Coordination and Disease Management  Case Manager Clinical Goal(s):  [name] Over the next 30 days, patient will work with CCM team to address needs related to disease management for recurrent UTI and community resources.   Interventions:   Internal collaboration with embedded BSW Bevelyn Ngo; patient update received  Placed outbound call to [facility], spoke with nursing manager Archie Patten; she provided the phone number for assigned nurse Mar Daring [phone]  Placed outbound call to [facility] nurse Mar Daring, left a voice message requesting a return phone call to discuss patient's current health status and DME needs  Placed outbound call to [facility], spoke with Sunny Schlein, she states the documentation needed for the DME has not yet been received  Sent in basket message to provider Arnette Felts FNP and CMA Marquette Old requesting new Rx for DME and visit note with diagnosis codes be  refaxed to [facility] fax [phone] (received response stating fax was resent at 12:24 pm today and was confirmed to have been sent w/o issues)  Placed outbound call back to [facility] [phone], spoke with Melvyn Neth, he confirmed 9 documents have been received, he will have Kris Hartmann working on this order to give me a call back  Inbound call received from Kris Hartmann with [facility], she confirmed the DME documentation has been received; she confirmed the DME is ready to be scheduled for delivery; she confirmed having the contact person to be POA [name] and confirmed having the contact #; she stated [name] will receive a call today to be notified and a delivery date will be scheduled with the family  Completed outbound call to [facility] [name]; advised the DME orders have been received; advised the DME is ready for delivery and she should receive a call from a team member from [facility] to schedule the delivery date/time-[name] verbalizes understanding, she states they have prepared the home and are ready to receive the equipment  Encouraged [name] notify the CCM team once she receives the call from [facility], discussed I will call [name] tomorrow to check in  Patient Self Care Activities:  . Currently UNABLE TO independently perform self care  Please see past updates related to this goal by clicking on the "Past Updates" button in the selected goal         The CCM team will reach out to the patient/caregiver again tomorrow, 05/29/18.     [name], RN,CCM Care Management  Coordinator [facility] Care Management/[facility]  Direct Phone: [phone]    

## 2018-05-28 NOTE — Patient Instructions (Signed)
Social Worker Visit Information  Goals we discussed today:  Goals Addressed            This Visit's Progress     Patient Stated   . "she needs a light weight wheel chair" (pt-stated)   Not on track    Son stated Current Barriers:  . Financial constraints . ADL IADL limitations . Limited education about how to obtain DME equipment  Clinical Social Work Clinical Goal(s):  [name] Over the next 30 days, client will work with SW to address concerns related to patient DME needs  Interventions: . Telephonic follow up with CCM SW . Chart reviewed to determine the patient did in face complete virtual visit with provider office on 5/5 . Discussed timeline to receive requested DME with the patients son and daughter in law . Educated the patients son and daughter in law on [place] guidelines that must be met to receive DME . Obtained permission by [name] to contact Ruth to inquire the status of DME orders - Mrs Landry Mellow provided CCM SW with the contact name and number to a representative whom she spoke with on 5/5.  [name] Contacted Intake Department at [employer] who reported the company was still in need of provider NPI number as well as narrative note justifying DME . Collaboration with the patients primary provider Dr. Baird Cancer as well as Minette Brine FNP who completed virtual visit on 5/5 to provide an update on what information is needed for the patient DME orders . Collaboration with CCM RN Case Manager regarding status of goal  Patient Self Care Activities:  . Currently UNABLE TO independently ambulate  Please see past updates related to this goal by clicking on the "Past Updates" button in the selected goal      . "we think she needs more oversight in the home" (pt-stated)   On track    Son stated Current Barriers:  . ADL IADL limitations . Inability to perform ADL's independently . Inability to perform IADL's independently  . Limited knowledge about Palliative  Care  Clinical Social Work Clinical Goal(s):  [name] Over the next 10 days the patient will be referred to a Palliatve Care provider to assist with goals of care and home management of the patients chronic conditions . Over the next 30 days the patient will engage with a Palliative Care provider  Interventions: . Telephonic follow up with the patients POA . Determined the patient is involved with Palliative Care as well as [facility] for home health . Assessed for patient condition - it is reported the patient is exhibiting poor PO intake, is now bed bound, has developed a wound on her L hip and is not engaging with conversation . Discussed the difference in home health, Palliative care, and Hospice care . Educated the patients POA on end of life decisions and the Hospice plan of care in relation to comfort care . Determined the family is open to [facility] but would like to receive U/A results prior to enrolling the patient . Encouraged the patients POA to ask questions about how/if COVID-19 will effect the type of Hospice care the patient may/may not receive in the home in relation to aid hours and nurse visits . Collaboration with the patients primary care provider to provide an update on the patients status and the POA's readiness to involve Hospice Care . Collaboration with Palliative Care nurse navigator to provide an update on the patients condition.  . Confirmed the patient  will have a virtual visit with Rebeca Allegra, NP with Palliative Care Friday May 8th at 1:00 pm . Collaboration with CCN RN Case Manager regarding patient decline and current condition  Patient Self Care Activities:  . Self administers medications as prescribed . Calls pharmacy for medication refills . Calls provider office for new concerns or questions  Please see past updates related to this goal by clicking on the "Past Updates" button in the selected goal      . "we would like help applying for assistance  programs" (pt-stated)   Not on track    POA stated:  Current Barriers:  . Financial constraints . Limited access to food . Limited education about patient assistance programs  Clinical Social Work Clinical Goal(s):  [name] Over the next 20 days the patients son and POA will complete and submit a Medicaid application on behalf of the patient . Over the next 20 days the patients son and POA will complete and submit a food stamp application on behalf of the patient . Over the next 30 day the patients son and POA will complete and submit a property tax relief application on behalf of the patient  Interventions: . Telephonic follow up to the patients son and daughter in law to assess the progression of stated goal . Determined the patients POA has yet to complete and/or submit Medicaid application due to a recent death in the family . Encouraged the patients POA to focus on this application when able as CCM SW can not complete for them  Patient Self Care Activities:  . Currently unable to express knowledge of assistance programs in which the patient qualifies  Please see past updates related to this goal by clicking on the "Past Updates" button in the selected goal        Other   . "My mom needs a working Location manager"   Not on track    Son stated Current Barriers:  . Financial constraints . Lacks knowledge of community resource: to assist with home modifications  Clinical Social Work Clinical Goal(s):  [name] Over the next 60 days, patient will work with SW to address concerns related to home modification needs.  Interventions: . Telephonic follow up by CCM SW . Assessed the status of the patients POA following up with referral agencies - it is determined Mrs. [name] has not followed up with [facility]) as she has experienced a death in her family . Collaboration with [employer] Department to determine if the patient would be eligible to receive  assistance with A/C repair- voice message left requesting a return call  Patient Self Care Activities:  . Currently unable to address home repair needs  Please see past updates related to this goal by clicking on the "Past Updates" button in the selected goal      . "We think she has another UTI, we need to check her urine"   On track    Family Stated: Current Barriers:  . Limited social support . ADL IADL limitations  Clinical Social Work Clinical Goal(s):  [name] Over the next 10 days, client will work with SW to address concerns related to concerns of a UTI  Interventions: . Telephonic follow up by CCM SW to the patients POA Chrissie Noa and [name] . Assessed for status of patient receiving order for a U/A . Determined the patient received a visit from her home health nurse on 5/5 who collected urine via an in and  out catheter . Obtained an update from the patients POA on patient physical condition - it is reported the patient has poor intake and output, skin breakdown, is now bedbound and nonverbal . Collaboration with CCM RN Case Manager updating on patients reported condition  Patient Self Care Activities:  . Currently UNABLE TO independently perform ADL's or iADL's  Please see past updates related to this goal by clicking on the "Past Updates" button in the selected goal          Materials Provided: Verbal education about resources provided by phone  Follow Up Plan: SW will follow up with patient by phone over the next 7 days   Daneen Schick, [place], CDP [facility] Social Worker [phone]

## 2018-05-28 NOTE — Patient Instructions (Signed)
Visit Information  Goals Addressed    . Assist with Care Coordination and Disease Management       Current Barriers:  [name] Knowledge Barriers related to resources and support available to address needs related to Care Coordination and Disease Management  Case Manager Clinical Goal(s):  [name] Over the next 30 days, patient will work with CCM team to address needs related to disease management for recurrent UTI and community resources.   Interventions:   Internal collaboration with embedded BSW Bevelyn Ngo; patient update received  Placed outbound call to [facility], spoke with nursing manager Archie Patten; she provided the phone number for assigned nurse Mar Daring [phone]  Placed outbound call to [facility] nurse Mar Daring, left a voice message requesting a return phone call to discuss patient's current health status and DME needs  Placed outbound call to [facility], spoke with Sunny Schlein, she states the documentation needed for the DME has not yet been received  Sent in basket message to provider Arnette Felts FNP and CMA Marquette Old requesting new Rx for DME and visit note with diagnosis codes be refaxed to [facility] fax [phone] (received response stating fax was resent at 12:24 pm today and was confirmed to have been sent w/o issues)  Placed outbound call back to [facility] [phone], spoke with Melvyn Neth, he confirmed 9 documents have been received, he will have Kris Hartmann working on this order to give me a call back  Inbound call received from Kris Hartmann with [facility], she confirmed the DME documentation has been received; she confirmed the DME is ready to be scheduled for delivery; she confirmed having the contact person to be POA [name] and confirmed having the contact #; she stated [name] will receive a call today to be notified and a delivery date will be scheduled with the family  Completed outbound call to [facility] [name]; advised the DME orders have been  received; advised the DME is ready for delivery and she should receive a call from a team member from [facility] to schedule the delivery date/time-[name] verbalizes understanding, she states they have prepared the home and are ready to receive the equipment  Encouraged [name] to call me once she receives the call from [facility], discussed I will call [name] tomorrow to check in   Patient Self Care Activities:  . Currently UNABLE TO independently perform self care  Please see past updates related to this goal by clicking on the "Past Updates" button in the selected goal        The patient verbalized understanding of instructions provided today and declined a print copy of patient instruction materials.   Telephone follow up appointment with CCM team member scheduled for: 05/29/18  [name], [facility] Care Management Coordinator [facility] Care Management/[facility]  Direct Phone: [phone]

## 2018-05-29 ENCOUNTER — Encounter: Payer: Self-pay | Admitting: Internal Medicine

## 2018-05-29 ENCOUNTER — Ambulatory Visit: Payer: Self-pay

## 2018-05-29 ENCOUNTER — Telehealth: Payer: Self-pay

## 2018-05-29 DIAGNOSIS — I2510 Atherosclerotic heart disease of native coronary artery without angina pectoris: Secondary | ICD-10-CM

## 2018-05-29 DIAGNOSIS — E1169 Type 2 diabetes mellitus with other specified complication: Secondary | ICD-10-CM | POA: Diagnosis not present

## 2018-05-29 DIAGNOSIS — I10 Essential (primary) hypertension: Secondary | ICD-10-CM | POA: Diagnosis not present

## 2018-05-29 DIAGNOSIS — R5381 Other malaise: Secondary | ICD-10-CM

## 2018-05-29 NOTE — Chronic Care Management (AMB) (Signed)
  Chronic Care Management   Follow Up Note   05/29/2018 Name: Kathleen Weeks MRN: [id number] DOB: [birth year]  Referred by: Dorothyann Peng, MD Reason for referral : Chronic Care Management (CCM RN Telephone Follow Up )   [name] is a 85 y.o. year old female who is a primary care patient of Dorothyann Peng, MD. The CCM team was consulted for assistance with chronic disease management and care coordination needs.    Review of patient status, including review of consultants reports, relevant laboratory and other test results, and collaboration with appropriate care team members and the patient's provider was performed as part of comprehensive patient evaluation and provision of chronic care management services.    I spoke with [facility], Mar Daring RN for a patient status update.   Goals Addressed    . Assist with Care Coordination and Disease Management       Current Barriers:  [name] Knowledge Barriers related to resources and support available to address needs related to Care Coordination and Disease Management  Case Manager Clinical Goal(s):  [name] Over the next 30 days, patient will work with CCM team to address needs related to disease management for recurrent UTI and community resources.   Interventions:   Placed [facility]bound call to [facility], spoke with nurse Mar Daring [phone]  Update received related to patient urine culture; nurse states urine was collected per via in/out cath; results are pending  Update received related to high potential for impaired skin integrity; nursed noted several area's of concern, noted to be stage 1; nurse educated caregivers on importance of monitoring skin integrity, turning, and shifting patients weight q 2 hrs  Informed patient's DME has been approved; hospital bed and transfer chair; advised delivery is pending  Provided nurse RN CM contact # and encouraged a call back to report urine results  Inbound call received  from [facility] [name]; she stated she did not receive the call regarding the delivery for the DME  Placed outbound call back to [facility] [phone], spoke with CSR, call transferred to [employer]; left a voice message to include my contact # and contact # for POA [name], requested a call back and or a call placed to POA to establish a delivery time for patient's DME - POA [name] made aware  Discussed Sherran Needs will call me once she receives the call back  Patient Self Care Activities:  . Currently UNABLE TO independently perform self care  Please see past updates related to this goal by clicking on the "Past Updates" button in the selected goal         Telephone follow up appointment with CCM team member scheduled for: 05/30/18   [name], [facility] Care Management Coordinator [facility] Care Management/[facility]  Direct Phone: [phone]

## 2018-05-29 NOTE — Patient Instructions (Signed)
Visit Information  Goals Addressed    . Assist with Care Coordination and Disease Management       Current Barriers:  [name] Knowledge Barriers related to resources and support available to address needs related to Care Coordination and Disease Management  Case Manager Clinical Goal(s):  [name] Over the next 30 days, patient will work with CCM team to address needs related to disease management for recurrent UTI and community resources.   Interventions:   Placed [facility] call to [facility], spoke with nurse Mar Daring [phone]  Update received related to patient urine culture; nurse states urine was collected per via in/out cath; results are pending  Update received related to high potential for impaired skin integrity; nursed noted several area's of concern, noted to be stage 1; nurse educated caregivers on importance of monitoring skin integrity, turning, and shifting patients weight q 2 hrs  Informed patient's DME has been approved; hospital bed and transfer chair; advised delivery is pending  Provided nurse RN CM contact # and encouraged a call back to report urine results  Inbound call received from [facility] [name]; she stated she did not receive the call regarding the delivery for the DME  Placed outbound call back to [facility] [phone], spoke with CSR, call transferred to [employer]; left a voice message to include my contact # and contact # for POA [name], requested a call back and or a call placed to POA to establish a delivery time for patient's DME - POA [name] made aware  Discussed Sherran Needs will call me once she receives the call back  Patient Self Care Activities:  . Currently UNABLE TO independently perform self care  Please see past updates related to this goal by clicking on the "Past Updates" button in the selected goal        The patient verbalized understanding of instructions provided today and declined a print copy of patient instruction materials.    Telephone follow up appointment with CCM team member scheduled for: 05/08020  [name], [facility] Care Management Coordinator [facility] Care Management/[facility]  Direct Phone: [phone]

## 2018-05-30 ENCOUNTER — Ambulatory Visit: Payer: Self-pay

## 2018-05-30 ENCOUNTER — Other Ambulatory Visit: Payer: Self-pay

## 2018-05-30 ENCOUNTER — Telehealth: Payer: Self-pay

## 2018-05-30 ENCOUNTER — Other Ambulatory Visit: Payer: Self-pay | Admitting: Adult Health Nurse Practitioner

## 2018-05-30 DIAGNOSIS — I2510 Atherosclerotic heart disease of native coronary artery without angina pectoris: Secondary | ICD-10-CM | POA: Diagnosis not present

## 2018-05-30 DIAGNOSIS — I10 Essential (primary) hypertension: Secondary | ICD-10-CM | POA: Diagnosis not present

## 2018-05-30 DIAGNOSIS — Z515 Encounter for palliative care: Secondary | ICD-10-CM

## 2018-05-30 DIAGNOSIS — E1169 Type 2 diabetes mellitus with other specified complication: Secondary | ICD-10-CM

## 2018-05-30 DIAGNOSIS — R5381 Other malaise: Secondary | ICD-10-CM

## 2018-05-30 NOTE — Patient Instructions (Signed)
Visit Information  Goals Addressed    . Assist with Care Coordination and Disease Management       Current Barriers:  [name] Knowledge Barriers related to resources and support available to address needs related to Care Coordination and Disease Management  Case Manager Clinical Goal(s):  [name] Over the next 30 days, patient will work with CCM team to address needs related to disease management for recurrent UTI and community resources.   Interventions:   Completed CCM RN telephone follow up with patient's son and daughter in law and POA Kathleen Weeks and Kathleen Weeks  Confirmed patient's hospital bed and transfer chair were delivered to the patient's home today  Discussed patient's health continues to decline; family reports alertness and dementia comes and goes, pt is only taking in small amounts of liquids, she is not eating, she is incontinent of urine and stool and she is displaying facial grimacing with movement  Informed Mr. & Mrs. Kathleen Weeks the patient's urine culture is negative per Dr. Allyne Gee  End of life discussion held with Mr. & Mrs. Kathleen Weeks; Mr. Kathleen Weeks is requesting Hospice be initiated next week after the patient receives one last PT visit  Plan to call Kathleen Weeks on Tuesday after PT for an update prior to starting Hospice  In basket message sent to Dr. Allyne Gee with notification and request for an order for an gel overlay for patient's hospital bed   Secure message forwarded to embedded BSW Bevelyn Ngo with patient update as well  Scheduled CCM RN telephone follow up with POA daughter in law Kathleen Weeks for next Tuesday   Patient Self Care Activities:   Verbalizes understanding of the education and information provided today . Currently UNABLE TO independently perform self care  Please see past updates related to this goal by clicking on the "Past Updates" button in the selected goal        The patient verbalized understanding of instructions provided today and declined a print copy  of patient instruction materials.   Telephone follow up appointment with CCM team member scheduled for: 06/03/18  [name], [facility] Care Management Coordinator [facility] Care Management/[facility]  Direct Phone: [phone]

## 2018-05-30 NOTE — Progress Notes (Signed)
The[facility] Pall[facility] Consult Note Telephone: [phone]  Fax: [phone]  PATIENT NAME: Kathleen Weeks DOB: 04/27/1922 MRN: [id number]  PRIMARY CARE PROVIDER:   Dorothyann PengSanders, Robyn, MD  REFERRING PROVIDER:  Dorothyann PengSanders, Robyn, MD [address] [place] [id number]  RESPONSIBLE PARTY:   Bernette RedbirdKenny and Armanda MagicWanza Cole, son and DIL  [phone]  Due to the COVID-19 crisis, this visit was done via telemedicine and it was initiated and consent by this patient and or family. Video-audio (telehealth) contact was unable to be done due to technical barriers from the patient's side.    RECOMMENDATIONS and PLAN:  1.  Recurrent UTIs.  Patient had urine sample taken to PCP office on 05/27/2018 by nurse from [facility].  Still waiting for results.  Family encouraged to follow up with PCP to check on results.    2. Dementia. FAST 7.  Patient has decreased mobility in which she spends most of her time in bed or a recliner.  Family states that hospital bed is supposed to be delivered today.  She does speak but mostly short phrases or one word responses.  She is usually continent of bowel and bladder but has had more frequent episodes of incontinence and this may be in part due to decreased mobility and/or disease progression.  Her appetite continues to decrease and family reports that she is losing weight but not sure how much as they cannot get her on scales with her weakness.  She eats bits of food and they are able to get her to drink fluids to keep her hydrated.  Family reports that she had about 9 ounces of water, juice, and ginger ale today with a few bites of oatmeal and yogurt.  Report that she had developed 3 pressure wounds to her left buttock.  One has healed over and the others are healing.  She still grimaces as if in pain with movement.    3. Goals of care.  Family states that she started working with PT last week and has an appointment this  afternoon.  Family wants to get the results of the UA and see how she does with therapy before making a decision about hospice.  States that they will call me back on Monday to discuss next steps.     I spent 25 minutes providing this consultation,  from 1:15 to 1:40. More than 50% of the time in this consultation was spent coordinating communication.   HISTORY OF PRESENT ILLNESS:  Kathleen Weeks is a 85 y.o. year old female with multiple medical problems including HTN, DMT2, mild dementia, recurrent UTIs. Palliative Care was asked to help address goals of care.   CODE STATUS: DNR  PPS: 40% HOSPICE ELIGIBILITY/DIAGNOSIS: yes/ progression of dementia causing decreased appetite (losing weight) and decreased mobility  PAST MEDICAL HISTORY:  Past Medical History:  Diagnosis Date  . Arthritis   . Diabetes mellitus   . Hypertension   . Renal insufficiency     SOCIAL HX:  Social History   Tobacco Use  . Smoking status: Former Games developermoker  . Smokeless tobacco: Never Used  Substance Use Topics  . Alcohol use: No    ALLERGIES: No Known Allergies   PERTINENT MEDICATIONS:  Outpatient Encounter Medications as of 05/30/2018  Medication Sig  . amoxicillin (AMOXIL) 250 MG capsule Take 1 capsule (250 mg total) by mouth 2 (two) times daily.  [name]. aspirin 81 MG chewable tablet Chew 1 tablet (81 mg total)  by mouth daily.  . cefUROXime (CEFTIN) 250 MG tablet Take 1 tablet (250 mg total) by mouth 2 (two) times daily with a meal. (Patient not taking: Reported on 05/06/2014)  . cephALEXin (KEFLEX) 500 MG capsule Take 1 capsule (500 mg total) by mouth 4 (four) times daily.  . ciprofloxacin-dexamethasone (CIPRODEX) otic suspension Place 4 drops into the right ear 2 (two) times daily. (Patient not taking: Reported on 05/06/2014)  . dextromethorphan-guaiFENesin (MUCINEX DM) 30-600 MG per 12 hr tablet Take 1 tablet by mouth 2 (two) times daily.  [name] ENSURE (ENSURE) Take 237 mLs by mouth 3 (three) times daily  between meals.  . metoprolol tartrate (LOPRESSOR) 25 MG tablet TAKE 1 TABLET BY MOUTH 2 TIMES DAILY. PATIENT NEEDS AN APPOINTMENT (BETA BLOCKER)  . naproxen sodium (ANAPROX) 220 MG tablet Take 220 mg by mouth 2 (two) times daily with a meal.  . olmesartan (BENICAR) 40 MG tablet Take 1 tablet (40 mg total) by mouth daily.  [name] oxyCODONE (OXY IR/ROXICODONE) 5 MG immediate release tablet Take 1 tablet (5 mg total) by mouth every 4 (four) hours as needed for moderate pain. (Patient not taking: Reported on 05/06/2014)  . pneumococcal 23 valent vaccine (PNU-IMMUNE) 25 MCG/0.5ML injection Inject 0.5 mLs into the muscle once. GIVEN AT DISCHARGE (Patient not taking: Reported on 06/01/2014)  . predniSONE (DELTASONE) 20 MG tablet Take 2 tablets (40 mg total) by mouth daily. (Patient not taking: Reported on 08/04/2015)   No facility-administered encounter medications on file as of 05/30/2018.       Amy Marlena Clipper, NP

## 2018-05-30 NOTE — Chronic Care Management (AMB) (Signed)
  Chronic Care Management   Follow Up Note   05/30/2018 Name: Kathleen Weeks MRN: [id number] DOB: [birth year]  Referred by: Kathleen Peng, MD Reason for referral : Chronic Care Management (CCM RN Telephone Follow Up )   Kathleen Weeks is a 85 y.o. year old female who is a primary care patient of Kathleen Peng, MD. The CCM team was consulted for assistance with chronic disease management and care coordination needs.    Review of patient status, including review of consultants reports, relevant laboratory and other test results, and collaboration with appropriate care team members and the patient's provider was performed as part of comprehensive patient evaluation and provision of chronic care management services.    I spoke to patient's son and daughter in law Mr. & Kathleen Weeks who are her POA's regarding patient's DME, declining health status and End of Life options.   Goals Addressed    . Assist with Care Coordination and Disease Management       Current Barriers:  [name] Knowledge Barriers related to resources and support available to address needs related to Care Coordination and Disease Management  Case Manager Clinical Goal(s):  [name] Over the next 30 days, patient will work with CCM team to address needs related to disease management for recurrent UTI and community resources.   Interventions:   Completed CCM RN telephone follow up with patient's son and daughter in law and POA Kathleen Weeks and Kathleen Weeks  Confirmed patient's hospital bed and transfer chair were delivered to the patient's home today  Discussed patient's health continues to decline; family reports alertness and dementia comes and goes, pt is only taking in small amounts of liquids, she is not eating, she is incontinent of urine and stool and she is displaying facial grimacing with movement  Informed Mr. & Kathleen Weeks the patient's urine culture is negative per Kathleen Weeks  End of life discussion held with Mr. & Mrs.  Kathleen Weeks; Kathleen Weeks is requesting Hospice be initiated next week after the patient receives one last PT visit  Plan to call Kathleen Weeks on Tuesday after PT for an update prior to starting Hospice  In basket message sent to Kathleen Weeks with notification and request for an order for an gel overlay for patient's hospital bed   Secure message forwarded to embedded BSW Kathleen Weeks with patient update as well  Scheduled CCM RN telephone follow up with POA daughter in law Kathleen Weeks for next Tuesday  Patient Self Care Activities:   Verbalizes understanding of the education and information provided today . Currently UNABLE TO independently perform self care  Please see past updates related to this goal by clicking on the "Past Updates" button in the selected goal        Telephone follow up appointment with CCM team member scheduled for: 06/03/18   [name], [facility] Care Management Coordinator [facility] Care Management/[facility]  Direct Phone: [phone]

## 2018-06-02 ENCOUNTER — Telehealth: Payer: Self-pay

## 2018-06-03 ENCOUNTER — Ambulatory Visit: Payer: Self-pay

## 2018-06-03 ENCOUNTER — Telehealth: Payer: Self-pay

## 2018-06-03 ENCOUNTER — Encounter: Payer: Self-pay | Admitting: Internal Medicine

## 2018-06-03 DIAGNOSIS — E1169 Type 2 diabetes mellitus with other specified complication: Secondary | ICD-10-CM

## 2018-06-03 DIAGNOSIS — I10 Essential (primary) hypertension: Secondary | ICD-10-CM

## 2018-06-03 DIAGNOSIS — R5381 Other malaise: Secondary | ICD-10-CM

## 2018-06-03 DIAGNOSIS — R4189 Other symptoms and signs involving cognitive functions and awareness: Secondary | ICD-10-CM

## 2018-06-03 NOTE — Chronic Care Management (AMB) (Signed)
Chronic Care Management    Clinical Social Work Follow Up Note  06/03/2018 Name: Kathleen Weeks MRN: [id number] DOB: 1922-04-16  Kathleen Weeks is a 85 y.o. year old female who is a primary care patient of [name], MD. The CCM team was consulted for assistance with [employer] and [employer].   Review of patient status, including review of consultants reports, other relevant assessments, and collaboration with appropriate care team members and the patient's provider was performed as part of comprehensive patient evaluation and provision of chronic care management services.    I placed a follow up call to the patients POA on today's date to follow up on progress of patient goals. I spoke with the patients daughter-in-law [name] who was able to provide updates on the patient.   Goals Addressed            This Visit's Progress     Patient Stated   . COMPLETED: "she needs a light weight wheel chair" (pt-stated)       Son stated Current Barriers:  . Financial constraints . ADL IADL limitations . Limited education about how to obtain DME equipment  Clinical Social Work Clinical Goal(s):  [name] Over the next 30 days, client will work with SW to address concerns related to patient DME needs  Interventions: . Telephonic follow up with CCM SW . Confirmed the patients DME equipment was delivered to the home on Friday May 8  Patient Self Care Activities:  . Currently UNABLE TO independently ambulate  Please see past updates related to this goal by clicking on the "Past Updates" button in the selected goal      . "we think she needs more oversight in the home" (pt-stated)   On track    Son stated Current Barriers:  . ADL IADL limitations . Inability to perform ADL's independently . Inability to perform IADL's independently  . Limited knowledge about Palliative Care  Clinical Social Work Clinical Goal(s):  [name] Over the next 10  days the patient will be referred to a Palliatve Care provider to assist with goals of care and home management of the patients chronic conditions . Over the next 30 days the patient will engage with a Palliative Care provider . New Goal established 5/12: Over the next 10 days the patient will engage with Hospice Services  Interventions: . Telephonic follow up with the patients POA . Discussed patients current status and goals of care with the patients family  . Determined the family would like to keep the patient in the home with comfort care . Collaborated with CCM RN Case Research scientist (medical) on POA goals . Outreached Palliative Care nurse navigator, [name], voice message left requesting return call . Collaboration with Palliative Care NP, [name] to discuss patients condition and communicate families desire to transition to hospice services . Collaboration with patients primary care provider regarding orders for hospice evaluation  Patient Self Care Activities:  . Self administers medications as prescribed . Calls pharmacy for medication refills . Calls provider office for new concerns or questions  Please see past updates related to this goal by clicking on the "Past Updates" button in the selected goal      . COMPLETED: "we want to know more about her benefits" (pt-stated)       Son stated  Current Barriers:  . Limited knowledge about the patients current Medicare Advantage plan  Clinical Social Work Clinical Goal(s):  [name] Over the next 30  days the patients son and POA will be able to demonstrate knowledge of the patients benefits.  Interventions: . Confirmed the patients POA received ordered items . Patients POA able to verbalize understanding of patient benefit  Patient Self Care Activities:  . Currently unable to verbalize payor benefits  Please see past updates related to this goal by clicking on the "Past Updates" button in the selected goal      . COMPLETED: "we  would like help applying for assistance programs" (pt-stated)       POA stated:  Current Barriers:  . Financial constraints . Limited access to food . Limited education about patient assistance programs  Clinical Social Work Clinical Goal(s):  [name] Over the next 20 days the patients son and POA will complete and submit a Medicaid application on behalf of the patient . Over the next 20 days the patients son and POA will complete and submit a food stamp application on behalf of the patient . Over the next 30 day the patients son and POA will complete and submit a property tax relief application on behalf of the patient  Interventions: [name] Goal is not met. At this time the family is focusing on end of life decisions and is not actively pursuing this goal. The patients family has all information and materials to act on this goal if/when needed.  Patient Self Care Activities:  . Currently unable to express knowledge of assistance programs in which the patient qualifies  Please see past updates related to this goal by clicking on the "Past Updates" button in the selected goal        Other   . COMPLETED: "My mom needs a working Location manager" This goal is not met due to lack of resources available       Son stated Current Barriers:  . Financial constraints . Lacks knowledge of community resource: to assist with home modifications  Clinical Social Work Clinical Goal(s):  [name] Over the next 60 days, patient will work with SW to address concerns related to home modification needs.  Interventions: . Contacted housing program with [employer]  . Determined the patient would not qualify for assistance due to the state of [place] not viewing air conditioning as an essential - it is reported a/c units are only repaired when both the heat and a/c are not working . Telephonic follow up to the patients POA to update on lack of resources to meet the patients need  Patient Self Care  Activities:  . Currently unable to address home repair needs  Please see past updates related to this goal by clicking on the "Past Updates" button in the selected goal      . COMPLETED: "We think she has another UTI, we need to check her urine"       Family Stated: Current Barriers:  . Limited social support . ADL IADL limitations  Clinical Social Work Clinical Goal(s):  [name] Over the next 10 days, client will work with SW to address concerns related to concerns of a UTI  Interventions: . Chart reviewed to determine the patients urine culture is negative  . Telephonic follow up to the patients POA who is aware of these results  Patient Self Care Activities:  . Currently UNABLE TO independently perform ADL's or iADL's  Please see past updates related to this goal by clicking on the "Past Updates" button in the selected goal          Follow Up Plan:  CCM SW requested the patients POA contact the CCM team if they are not contacted by a member of the Hospice Team within the next 24-48 hours in order to assist with referral coordination.    Daneen Schick, BSW, CDP [facility] / [facility] Care Management Social Worker [phone]  Total time spent performing care coordination and/or care management activities with the patient by phone or face to face =  23 minutes.

## 2018-06-03 NOTE — Patient Instructions (Signed)
Social Worker Visit Information  Goals we discussed today:  Goals Addressed            This Visit's Progress     Patient Stated   . COMPLETED: "she needs a light weight wheel chair" (pt-stated)       Son stated Current Barriers:  . Financial constraints . ADL IADL limitations . Limited education about how to obtain DME equipment  Clinical Social Work Clinical Goal(s):  [name] Over the next 30 days, client will work with SW to address concerns related to patient DME needs  Interventions: . Telephonic follow up with CCM SW . Confirmed the patients DME equipment was delivered to the home on Friday May 8  Patient Self Care Activities:  . Currently UNABLE TO independently ambulate  Please see past updates related to this goal by clicking on the "Past Updates" button in the selected goal      . "we think she needs more oversight in the home" (pt-stated)   On track    Son stated Current Barriers:  . ADL IADL limitations . Inability to perform ADL's independently . Inability to perform IADL's independently  . Limited knowledge about Palliative Care  Clinical Social Work Clinical Goal(s):  [name] Over the next 10 days the patient will be referred to a Palliatve Care provider to assist with goals of care and home management of the patients chronic conditions . Over the next 30 days the patient will engage with a Palliative Care provider . New Goal established 5/12: Over the next 10 days the patient will engage with Hospice Services  Interventions: . Telephonic follow up with the patients POA . Discussed patients current status and goals of care with the patients family  . Determined the family would like to keep the patient in the home with comfort care . Collaborated with CCM RN Case Research scientist (medical) on POA goals . Outreached Palliative Care nurse navigator, [name], voice message left requesting return call . Collaboration with Palliative Care NP, [name] to discuss patients  condition and communicate families desire to transition to hospice services . Collaboration with patients primary care provider regarding orders for hospice evaluation  Patient Self Care Activities:  . Self administers medications as prescribed . Calls pharmacy for medication refills . Calls provider office for new concerns or questions  Please see past updates related to this goal by clicking on the "Past Updates" button in the selected goal      . COMPLETED: "we want to know more about her benefits" (pt-stated)       Son stated  Current Barriers:  . Limited knowledge about the patients current Medicare Advantage plan  Clinical Social Work Clinical Goal(s):  [name] Over the next 30 days the patients son and POA will be able to demonstrate knowledge of the patients benefits.  Interventions: . Confirmed the patients POA received ordered items . Patients POA able to verbalize understanding of patient benefit  Patient Self Care Activities:  . Currently unable to verbalize payor benefits  Please see past updates related to this goal by clicking on the "Past Updates" button in the selected goal      . COMPLETED: "we would like help applying for assistance programs" (pt-stated)       POA stated:  Current Barriers:  . Financial constraints . Limited access to food . Limited education about patient assistance programs  Clinical Social Work Clinical Goal(s):  [name] Over the next 20 days the patients son and POA will  complete and submit a Medicaid application on behalf of the patient . Over the next 20 days the patients son and POA will complete and submit a food stamp application on behalf of the patient . Over the next 30 day the patients son and POA will complete and submit a property tax relief application on behalf of the patient  Interventions: [name] Goal is not met. At this time the family is focusing on end of life decisions and is not actively pursuing this goal. The patients family has  all information and materials to act on this goal if/when needed.  Patient Self Care Activities:  . Currently unable to express knowledge of assistance programs in which the patient qualifies  Please see past updates related to this goal by clicking on the "Past Updates" button in the selected goal        Other   . COMPLETED: "My mom needs a working Engineer, water       Son stated Current Barriers:  . Financial constraints . Lacks knowledge of community resource: to assist with home modifications  Clinical Social Work Clinical Goal(s):  [name] Over the next 60 days, patient will work with SW to address concerns related to home modification needs.  Interventions: . Contacted housing program with [employer]  . Determined the patient would not qualify for assistance due to the state of [place] not viewing air conditioning as an essential - it is reported a/c units are only repaired when both the heat and a/c are not working . Telephonic follow up to the patients POA to update on lack of resources to meet the patients need  Patient Self Care Activities:  . Currently unable to address home repair needs  Please see past updates related to this goal by clicking on the "Past Updates" button in the selected goal      . COMPLETED: "We think she has another UTI, we need to check her urine"       Family Stated: Current Barriers:  . Limited social support . ADL IADL limitations  Clinical Social Work Clinical Goal(s):  [name] Over the next 10 days, client will work with SW to address concerns related to concerns of a UTI  Interventions: . Chart reviewed to determine the patients urine culture is negative  . Telephonic follow up to the patients POA who is aware of these results  Patient Self Care Activities:  . Currently UNABLE TO independently perform ADL's or iADL's  Please see past updates related to this goal by clicking on the "Past Updates" button in the selected goal           Materials Provided: Verbal education about hospice services provided by phone  Follow Up Plan: CCM team will follow up with the patients family over the next 3-4 days to determine status of hospice referral.   Daneen Schick, BSW, CDP [facility] / [facility] Management Social Worker [phone]

## 2018-06-04 ENCOUNTER — Ambulatory Visit: Payer: Self-pay

## 2018-06-04 DIAGNOSIS — Z681 Body mass index (BMI) 19 or less, adult: Secondary | ICD-10-CM

## 2018-06-04 DIAGNOSIS — E1169 Type 2 diabetes mellitus with other specified complication: Secondary | ICD-10-CM | POA: Diagnosis not present

## 2018-06-04 DIAGNOSIS — R5381 Other malaise: Secondary | ICD-10-CM

## 2018-06-04 DIAGNOSIS — L89323 Pressure ulcer of left buttock, stage 3: Secondary | ICD-10-CM

## 2018-06-04 DIAGNOSIS — I10 Essential (primary) hypertension: Secondary | ICD-10-CM | POA: Diagnosis not present

## 2018-06-04 DIAGNOSIS — Z741 Need for assistance with personal care: Secondary | ICD-10-CM

## 2018-06-04 DIAGNOSIS — F0280 Dementia in other diseases classified elsewhere without behavioral disturbance: Secondary | ICD-10-CM | POA: Diagnosis not present

## 2018-06-04 DIAGNOSIS — G309 Alzheimer's disease, unspecified: Secondary | ICD-10-CM | POA: Diagnosis not present

## 2018-06-04 DIAGNOSIS — N189 Chronic kidney disease, unspecified: Secondary | ICD-10-CM

## 2018-06-04 DIAGNOSIS — R4189 Other symptoms and signs involving cognitive functions and awareness: Secondary | ICD-10-CM

## 2018-06-04 DIAGNOSIS — E1122 Type 2 diabetes mellitus with diabetic chronic kidney disease: Secondary | ICD-10-CM

## 2018-06-04 DIAGNOSIS — I129 Hypertensive chronic kidney disease with stage 1 through stage 4 chronic kidney disease, or unspecified chronic kidney disease: Secondary | ICD-10-CM

## 2018-06-04 DIAGNOSIS — Z7401 Bed confinement status: Secondary | ICD-10-CM

## 2018-06-04 DIAGNOSIS — Z87440 Personal history of urinary (tract) infections: Secondary | ICD-10-CM

## 2018-06-04 NOTE — Chronic Care Management (AMB) (Signed)
  Chronic Care Management   Follow Up Note   06/04/2018 Name: Kathleen Weeks MRN: [id number] DOB: March 02, 1922  Referred by: Dorothyann Peng, MD Reason for referral : Chronic Care Management (CCM RN Telephone Follow Up )   [name] is a 85 y.o. year old female who is a primary care patient of Dorothyann Peng, MD. The CCM team was consulted for assistance with chronic disease management and care coordination needs.    Review of patient status, including review of consultants reports, relevant laboratory and other test results, and collaboration with appropriate care team members and the patient's provider was performed as part of comprehensive patient evaluation and provision of chronic care management services.    I spoke with POA [name] regarding Hospice and DME.   Goals Addressed    . Assist with Care Coordination and Disease Management       Current Barriers:  [name] Knowledge Barriers related to resources and support available to address needs related to Care Coordination and Disease Management  Case Manager Clinical Goal(s):  [name] Over the next 30 days, patient will work with CCM team to address needs related to disease management for recurrent UTI and community resources.   Interventions:   Inbound call received from POA [name]  Confirmed patient's hospital bed and transfer chair were delivered to the patient's home today  Discussed patient's health continues to decline; Sherran Needs reports no change in the patient's condition  Answered questions regarding patient's gel overlay mattress topper and Hospice initiation  Outbound call placed to [employer], left a voice message for the logistics department requesting a call back and or a call be placed to POA [name] to coordinate the delivery of the gel cushion overlay  Internal collaboration with BSW Bevelyn Ngo regarding f/u status with Angelique Holm NP with [facility]  Secure message sent to Dr. Allyne Gee confirming  the Hospice order was faxed to [facility] on 06/03/18  Placed outbound call to POA [name]; discussed Hospice is scheduled to make a home visit today at 2 PM; discussed I left a vm for [employer] logistics requesting a return call to myself and or Ms. Richardson Dopp to coordinate the delivery of the gel overlay; advised [employer] CSR stated the gel overlay is scheduled to be delivered tomorrow-[name] verbalizes understanding  Encouraged Ms. Cole to call RN CM if needed, discussed hours of nurse availability  Scheduled CCM follow up call with Sherran Needs for about 1 week    Patient Self Care Activities:   Verbalizes understanding of the education and information provided today . Currently UNABLE TO independently perform self care  Please see past updates related to this goal by clicking on the "Past Updates" button in the selected goal         The CM team will reach out to the patient again over the next 7-10 days.    [name], RN,CCM Care Management Coordinator [facility] Care Management/[facility]  Direct Phone: [phone]

## 2018-06-04 NOTE — Patient Instructions (Signed)
Visit Information  Goals Addressed    . Assist with Care Coordination and Disease Management       Current Barriers:  [name] Knowledge Barriers related to resources and support available to address needs related to Care Coordination and Disease Management  Case Manager Clinical Goal(s):  [name] Over the next 30 days, patient will work with CCM team to address needs related to disease management for recurrent UTI and community resources.   Interventions:   Inbound call received from POA [name]  Confirmed patient's hospital bed and transfer chair were delivered to the patient's home today  Discussed patient's health continues to decline; Sherran Needs reports no change in the patient's condition  Answered questions regarding patient's gel overlay mattress topper and Hospice initiation  Outbound call placed to [employer], left a voice message for the logistics department requesting a call back and or a call be placed to POA [name] to coordinate the delivery of the gel cushion overlay  Internal collaboration with BSW Bevelyn Ngo regarding f/u status with Angelique Holm NP with [facility]  Secure message sent to Dr. Allyne Gee confirming the Hospice order was faxed to [facility] on 06/03/18  Placed outbound call to POA [name]; discussed Hospice is scheduled to make a home visit today at 2 PM; discussed I left a vm for [employer] logistics requesting a return call to myself and or Ms. Richardson Dopp to coordinate the delivery of the gel overlay; advised [employer] CSR stated the gel overlay is scheduled to be delivered tomorrow-[name] verbalizes understanding  Encouraged Ms. Cole to call RN CM if needed, discussed hours of nurse availability  Scheduled CCM follow up call with Sherran Needs for about 1 week    Patient Self Care Activities:   Verbalizes understanding of the education and information provided today . Currently UNABLE TO independently perform self care  Please see past updates related to this goal  by clicking on the "Past Updates" button in the selected goal        The patient verbalized understanding of instructions provided today and declined a print copy of patient instruction materials.   The CCM team will reach out to the patient again over the next 7-10 days.   [name], RN,CCM Care Management Coordinator [facility] Care Management/[facility]  Direct Phone: [phone]

## 2018-06-06 ENCOUNTER — Telehealth: Payer: Self-pay

## 2018-06-06 ENCOUNTER — Ambulatory Visit: Payer: Self-pay

## 2018-06-06 DIAGNOSIS — E1169 Type 2 diabetes mellitus with other specified complication: Secondary | ICD-10-CM

## 2018-06-06 DIAGNOSIS — I10 Essential (primary) hypertension: Secondary | ICD-10-CM

## 2018-06-06 DIAGNOSIS — R5381 Other malaise: Secondary | ICD-10-CM

## 2018-06-06 NOTE — Chronic Care Management (AMB) (Signed)
  Chronic Care Management   Outreach Note  06/06/2018 Name: Kathleen Weeks MRN: [id number] DOB: May 04, 1922  Referred by: [name], MD Reason for referral : Care Coordination   I placed an unsuccessful outreach to the patients POA and daughter in law Kathleen Weeks in effort to confirm patient status with Hospice services. I left a HIPAA compliant voice message requesting a return call should further CCM SW assistance be needed.  Follow Up Plan: No further follow up required: I have collaborated with CCM RN Case Manager who has a planned call to the patients POA within the next week to assess patients involvment with hospice. All SW goals have been met.  Daneen Schick, BSW, CDP [facility] / [facility] Care Management Social Worker [phone]  Total time spent performing care coordination and/or care management activities with the patient by phone or face to face = 5 minutes.

## 2018-06-11 ENCOUNTER — Telehealth: Payer: Self-pay

## 2018-06-12 ENCOUNTER — Ambulatory Visit: Payer: Self-pay

## 2018-06-12 ENCOUNTER — Telehealth: Payer: Self-pay

## 2018-06-12 ENCOUNTER — Other Ambulatory Visit: Payer: Self-pay

## 2018-06-12 DIAGNOSIS — I10 Essential (primary) hypertension: Secondary | ICD-10-CM

## 2018-06-12 DIAGNOSIS — E1169 Type 2 diabetes mellitus with other specified complication: Secondary | ICD-10-CM | POA: Diagnosis not present

## 2018-06-12 DIAGNOSIS — R4189 Other symptoms and signs involving cognitive functions and awareness: Secondary | ICD-10-CM

## 2018-06-12 DIAGNOSIS — R5381 Other malaise: Secondary | ICD-10-CM

## 2018-06-13 ENCOUNTER — Other Ambulatory Visit: Payer: Self-pay | Admitting: Internal Medicine

## 2018-06-13 NOTE — Chronic Care Management (AMB) (Signed)
Care Management    06/13/2018 Name: DESTENI PISCOPO MRN: [id number] DOB: 12/31/1922  Referred by: [name], MD Reason for referral : Chronic Care Management (RNCM Telephone Follow Up )  I spoke with patients POA's Remo Lipps and [name] by telephone today to f/u on patient's Hospice Care.  Ms. Jazzmyn Filion Nicolini has met all care management goals and now has Hospice Care in place. Review of patient status, including review of consultants reports, relevant laboratory and other test results, and collaboration with appropriate care team members and the patient's provider was performed as part of comprehensive patient evaluation and provision of care management services. The care management team has provided direct contact information to patients POA's Remo Lipps and [name] and has informed them that we are available to assist with further care management needs should they arise.     Goals Addressed      Patient Stated   . COMPLETED: "we think she needs more oversight in the home" (pt-stated)       Son stated Current Barriers:  . ADL IADL limitations . Inability to perform ADL's independently . Inability to perform IADL's independently  . Limited knowledge about Palliative Care  Clinical Social Work Clinical Goal(s):  [name] Over the next 10 days the patient will be referred to a Palliatve Care provider to assist with goals of care and home management of the patients chronic conditions . Over the next 30 days the patient will engage with a Palliative Care provider . New Goal established 5/12: Over the next 10 days the patient will engage with Hospice Services  Interventions:  Completed on 06/12/18: completed with POA [name] and Jamal Collin   Assessed patient's current health status - Wallis Bamberg reports no change at this time  Assessed for ongoing Hospice services - Wallis Bamberg and Remo Lipps confirm Hospice is in place; they are very happy with the care the patient is receiving; the patient  will have a CNA twice weekly through Hospice starting next week  Assessed for questions or other care coordination needs - family denies   Advised Wallis Bamberg and Remo Lipps the embedded CCM team will close patients case at this time due to [place] being in place  Encouraged Wallis Bamberg to contact the CCM if further assistance for this patient is needed - Wallis Bamberg verbalizes understanding and states she is very appreciative of the help she received from the CCM team and Dr. Baird Cancer  Secure message forwarded to embedded BSW Daneen Schick and Dr. Baird Cancer with an update  Patient Self Care Activities:  . Self administers medications as prescribed . Calls pharmacy for medication refills . Calls provider office for new concerns or questions  Please see past updates related to this goal by clicking on the "Past Updates" button in the selected goal       Other   . COMPLETED: Assist with Care Coordination and Disease Management       Current Barriers:  [name] Knowledge Barriers related to resources and support available to address needs related to Care Coordination and Disease Management  Case Manager Clinical Goal(s):  [name] Over the next 30 days, patient will work with CCM team to address needs related to disease management for recurrent UTI and community resources.   Interventions:  Completed on 06/12/18: completed with POA [name] and Jamal Collin   Assessed patient's current health status - Wallis Bamberg reports no change at this time  Assessed for ongoing Hospice services - Wallis Bamberg and Remo Lipps confirm Hospice is in place; they  are very happy with the care the patient is receiving; the patient will have a CNA twice weekly through Hospice starting next week  Assessed for questions or other care coordination needs - family denies   Advised Wallis Bamberg and Remo Lipps the embedded CCM team will close patients case at this time due to [place] being in place  Encouraged Wallis Bamberg to contact the CCM if further assistance for this  patient is needed - Wallis Bamberg verbalizes understanding and states she is very appreciative of the help she received from the CCM team and Dr. Baird Cancer  Secure message forwarded to embedded BSW Daneen Schick and Dr. Baird Cancer with an update  Patient Self Care Activities:   Verbalizes understanding of the education and information provided today . Currently UNABLE TO independently perform self care  Please see past updates related to this goal by clicking on the "Past Updates" button in the selected goal         Follow up plan: Patients POA's Remo Lipps and [name] have been provided with contact information for the chronic care management team and has been advised to call with any health related questions or concerns.   Barb Merino, RN,CCM Care Management Coordinator [facility]  Direct Phone: [phone]

## 2018-06-13 NOTE — Patient Instructions (Signed)
Visit Information  Goals Addressed            This Visit's Progress     Patient Stated   . COMPLETED: "we think she Weeks more oversight in the home" (pt-stated)       Son stated Current Barriers:  . ADL IADL limitations . Inability to perform ADL's independently . Inability to perform IADL's independently  . Limited knowledge about Palliative Care  Clinical Social Work Clinical Goal(s):  [name]. Over the next 10 days the patient will be referred to a Palliatve Care provider to assist with goals of care and home management of the patients chronic conditions . Over the next 30 days the patient will engage with a Palliative Care provider . New Goal established 5/12: Over the next 10 days the patient will engage with Hospice Services  Interventions:  Completed on 06/12/18: completed with POA Kathleen Weeks and Kathleen Weeks   Assessed patient's current health status - Kathleen Weeks reports no change at this time  Assessed for ongoing Hospice services - Kathleen Weeks and Kathleen Weeks confirm Hospice is in place; they are very happy with the care the patient is receiving; the patient will have a CNA twice weekly through Hospice starting next week  Assessed for questions or other care coordination Weeks - family denies   Advised Kathleen Weeks and Kathleen Weeks the embedded CCM team will close patients case at this time due to Hospice Care being in place  Encouraged Kathleen Weeks to contact the CCM if further assistance for this patient is needed - Kathleen Weeks verbalizes understanding and states she is very appreciative of the help she received from the CCM team and Kathleen Weeks  Secure message forwarded to embedded BSW Kathleen Weeks and Kathleen Weeks with an update   Patient Self Care Activities:  . Self administers medications as prescribed . Calls pharmacy for medication refills . Calls provider office for new concerns or questions  Please see past updates related to this goal by clicking on the "Past Updates" button in the selected goal         Other   . COMPLETED: Assist with Care Coordination and Disease Management       Current Barriers:  [name]. Knowledge Barriers related to resources and support available to address Weeks related to Care Coordination and Disease Management  Case Manager Clinical Goal(s):  [name]. Over the next 30 days, patient will work with CCM team to address Weeks related to disease management for recurrent UTI and community resources.   Interventions:  Completed on 06/12/18: completed with POA Kathleen Weeks and Kathleen Weeks   Assessed patient's current health status - Kathleen Weeks reports no change at this time  Assessed for ongoing Hospice services - Kathleen Weeks and Kathleen Weeks confirm Hospice is in place; they are very happy with the care the patient is receiving; the patient will have a CNA twice weekly through Hospice starting next week  Assessed for questions or other care coordination Weeks - family denies   Advised Kathleen Weeks and Kathleen Weeks the embedded CCM team will close patients case at this time due to Hospice Care being in place  Encouraged Kathleen Weeks to contact the CCM if further assistance for this patient is needed - Kathleen Weeks verbalizes understanding and states she is very appreciative of the help she received from the CCM team and Kathleen Weeks  Secure message forwarded to embedded BSW Kathleen Weeks and Kathleen Weeks with an update  Patient Self Care Activities:   Verbalizes understanding of the education and information provided today . Currently  UNABLE TO independently perform self care  Please see past updates related to this goal by clicking on the "Past Updates" button in the selected goal        The patient verbalized understanding of instructions provided today and declined a print copy of patient instruction materials.   The patients POA's Kathleen Weeks and Kathleen Weeks have been provided with contact information for the chronic care management team and has been advised to call with any health related questions or concerns.    Kathleen Sale, RN,CCM Care Management Coordinator [facility]  Direct Phone: [phone]

## 2018-06-20 ENCOUNTER — Other Ambulatory Visit: Payer: Self-pay

## 2018-06-23 ENCOUNTER — Other Ambulatory Visit: Payer: Self-pay | Admitting: Internal Medicine

## 2018-07-23 DEATH — deceased
